# Patient Record
Sex: Male | Born: 1945 | Race: Black or African American | Hispanic: No | Marital: Married | Smoking: Never smoker
Health system: Southern US, Community
[De-identification: ages and names within clinical notes are randomized; demographics above are authoritative.]

## PROBLEM LIST (undated history)

## (undated) DIAGNOSIS — R001 Bradycardia, unspecified: Secondary | ICD-10-CM

## (undated) DIAGNOSIS — E669 Obesity, unspecified: Secondary | ICD-10-CM

## (undated) DIAGNOSIS — K648 Other hemorrhoids: Secondary | ICD-10-CM

## (undated) DIAGNOSIS — J342 Deviated nasal septum: Secondary | ICD-10-CM

## (undated) DIAGNOSIS — G4730 Sleep apnea, unspecified: Secondary | ICD-10-CM

## (undated) DIAGNOSIS — C61 Malignant neoplasm of prostate: Secondary | ICD-10-CM

## (undated) DIAGNOSIS — H9190 Unspecified hearing loss, unspecified ear: Secondary | ICD-10-CM

## (undated) DIAGNOSIS — R61 Generalized hyperhidrosis: Secondary | ICD-10-CM

## (undated) DIAGNOSIS — R209 Unspecified disturbances of skin sensation: Secondary | ICD-10-CM

## (undated) DIAGNOSIS — F419 Anxiety disorder, unspecified: Secondary | ICD-10-CM

## (undated) DIAGNOSIS — J301 Allergic rhinitis due to pollen: Secondary | ICD-10-CM

## (undated) DIAGNOSIS — K635 Polyp of colon: Secondary | ICD-10-CM

## (undated) DIAGNOSIS — L309 Dermatitis, unspecified: Secondary | ICD-10-CM

## (undated) DIAGNOSIS — M1990 Unspecified osteoarthritis, unspecified site: Secondary | ICD-10-CM

## (undated) DIAGNOSIS — R809 Proteinuria, unspecified: Secondary | ICD-10-CM

## (undated) DIAGNOSIS — N529 Male erectile dysfunction, unspecified: Secondary | ICD-10-CM

## (undated) DIAGNOSIS — I10 Essential (primary) hypertension: Secondary | ICD-10-CM

## (undated) DIAGNOSIS — L509 Urticaria, unspecified: Secondary | ICD-10-CM

## (undated) DIAGNOSIS — I129 Hypertensive chronic kidney disease with stage 1 through stage 4 chronic kidney disease, or unspecified chronic kidney disease: Secondary | ICD-10-CM

## (undated) DIAGNOSIS — R32 Unspecified urinary incontinence: Secondary | ICD-10-CM

## (undated) DIAGNOSIS — R1013 Epigastric pain: Secondary | ICD-10-CM

## (undated) HISTORY — DX: Unspecified osteoarthritis, unspecified site: M19.90

## (undated) HISTORY — DX: Male erectile dysfunction, unspecified: N52.9

## (undated) HISTORY — PX: OTHER SURGICAL HISTORY: SHX169

## (undated) HISTORY — DX: Proteinuria, unspecified: R80.9

## (undated) HISTORY — DX: Other hemorrhoids: K64.8

## (undated) HISTORY — DX: Hypertensive chronic kidney disease with stage 1 through stage 4 chronic kidney disease, or unspecified chronic kidney disease: I12.9

## (undated) HISTORY — DX: Epigastric pain: R10.13

## (undated) HISTORY — DX: Urticaria, unspecified: L50.9

## (undated) HISTORY — DX: Obesity, unspecified: E66.9

## (undated) HISTORY — DX: Unspecified disturbances of skin sensation: R20.9

## (undated) HISTORY — DX: Bradycardia, unspecified: R00.1

## (undated) HISTORY — DX: Allergic rhinitis due to pollen: J30.1

## (undated) HISTORY — DX: Generalized hyperhidrosis: R61

## (undated) HISTORY — DX: Unspecified hearing loss, unspecified ear: H91.90

## (undated) HISTORY — DX: Unspecified urinary incontinence: R32

## (undated) HISTORY — DX: Polyp of colon: K63.5

## (undated) HISTORY — DX: Malignant neoplasm of prostate: C61

## (undated) HISTORY — DX: Dermatitis, unspecified: L30.9

## (undated) HISTORY — PX: TONSILLECTOMY: SUR1361

## (undated) HISTORY — DX: Deviated nasal septum: J34.2

## (undated) HISTORY — PX: PROSTATE BIOPSY: SHX241

## (undated) HISTORY — PX: COLONOSCOPY: SHX174

---

## 2002-06-30 ENCOUNTER — Ambulatory Visit (HOSPITAL_BASED_OUTPATIENT_CLINIC_OR_DEPARTMENT_OTHER): Admission: RE | Admit: 2002-06-30 | Discharge: 2002-06-30 | Payer: Self-pay | Admitting: Internal Medicine

## 2008-06-05 DIAGNOSIS — C61 Malignant neoplasm of prostate: Secondary | ICD-10-CM

## 2008-06-05 HISTORY — DX: Malignant neoplasm of prostate: C61

## 2012-08-05 DIAGNOSIS — I10 Essential (primary) hypertension: Secondary | ICD-10-CM | POA: Insufficient documentation

## 2013-12-28 ENCOUNTER — Telehealth: Payer: Self-pay | Admitting: *Deleted

## 2013-12-28 NOTE — Telephone Encounter (Signed)
Called patient to introduce myself as Prostate Oncology Navigator and coordinator of the [facility] [place]; spoke with his wife as he was not available.  Confirmed his referral for the clinic on 01/05/14, understanding of the location of [place], arrival time of 7:45, registration procedure, and format of clinic.  She verbalized understanding.  I provided my phone number and encouraged them to call me if there are questions after receiving the Information Packet or prior to my call the day before clinic.  She verbalized understanding and indicated she would pass on information to her husband.  Gayleen Orem, RN, BSN, [facility] Prostate Oncology Navigator [phone]

## 2014-01-02 ENCOUNTER — Encounter: Payer: Self-pay | Admitting: Radiation Oncology

## 2014-01-02 NOTE — Progress Notes (Addendum)
GU Location of Tumor / Histology: prostate  If Prostate Cancer, Gleason Score is (4 + 3) and PSA is (5.25 on 12/20/13)  Patient presented 05/2008 with signs/symptoms of: elevated PSA  Biopsies of prostate (if applicable) revealed:  7/68/08     10/16/11   10/11/09   08/31/2008   06/05/2008   Past/Anticipated interventions by urology, if any: repeated biopsies, PSA  Past/Anticipated interventions by medical oncology, if any: none  Weight changes, if any:   Bowel/Bladder complaints, if any:  IPSS 10  Nausea/Vomiting, if any:   Pain issues, if any:    SAFETY ISSUES:  Prior radiation? no  Pacemaker/ICD? no  Possible current pregnancy? na  Is the patient on methotrexate? no  Current Complaints / other details:  Pension scheme manager, married, no children

## 2014-01-04 ENCOUNTER — Encounter: Payer: Self-pay | Admitting: *Deleted

## 2014-01-05 ENCOUNTER — Encounter: Payer: Self-pay | Admitting: Radiation Oncology

## 2014-01-05 ENCOUNTER — Ambulatory Visit (HOSPITAL_BASED_OUTPATIENT_CLINIC_OR_DEPARTMENT_OTHER): Payer: 59 | Admitting: Oncology

## 2014-01-05 ENCOUNTER — Encounter: Payer: Self-pay | Admitting: Specialist

## 2014-01-05 ENCOUNTER — Encounter: Payer: Self-pay | Admitting: Oncology

## 2014-01-05 ENCOUNTER — Ambulatory Visit
Admission: RE | Admit: 2014-01-05 | Discharge: 2014-01-05 | Disposition: A | Discharge status: Home / Self Care | Payer: 59 | Source: Ambulatory Visit | Attending: Radiation Oncology | Admitting: Radiation Oncology

## 2014-01-05 VITALS — BP 156/98 | HR 64 | Temp 97.6°F | Resp 20 | Ht 71.0 in | Wt 217.9 lb

## 2014-01-05 DIAGNOSIS — C61 Malignant neoplasm of prostate: Secondary | ICD-10-CM

## 2014-01-05 HISTORY — DX: Essential (primary) hypertension: I10

## 2014-01-05 HISTORY — DX: Malignant neoplasm of prostate: C61

## 2014-01-05 HISTORY — DX: Sleep apnea, unspecified: G47.30

## 2014-01-05 NOTE — Progress Notes (Signed)
Met with patient as part of [facility].  Reintroduced my role as his navigator and encouraged him to call as he proceeds with treatments and appointments at [facility].  Provided the accompanying Care Plan Summary:                                           Care Plan Summary  Name:  [name]. Every DOB:  03/27/1946  Your Medical Team:   Urologist -  Dr. Raynelle Bring, [facility]  Radiation Oncologist - Dr. Tyler Pita, [facility]   Medical Oncologist - Dr. Zola Button, [place] Recommendations: 1) Hormone therapy plus radiation. 2) Surgery - prostatectomy. * These recommendations are based on information available as of today's consult.  Recommendations may change depending on the results of further tests or exams. Next Steps: 1) Contact Dr. Alinda Money with your decision.  When appointments need to be scheduled, you will be contacted by [facility] and/or [facility].  Questions? Please do not hesitate to call Gayleen Orem, RN, BSN, [facility] at [phone] with any questions or concerns.  Liliane Channel is Counsellor and is available to assist you while you're receiving your medical care at [facility]. ______________________________________________________________________________________________________________________  I encouraged him to call me with any questions or concerns as his treatments progress.  He indicated understanding.  Gayleen Orem, RN, BSN, [facility] Prostate Oncology Navigator [phone]

## 2014-01-05 NOTE — Progress Notes (Signed)
Radiation Oncology         [phone] ________________________________  Multidisciplinary Prostate Cancer Clinic  Initial Radiation Oncology Consultation  Name: Andrew Rogers MRN: [id number]  Date: 01/05/2014  DOB: Oct 17, 1945  NA:TFTDD,UKGU M, MD  Raynelle Bring, MD   REFERRING PHYSICIAN: Raynelle Bring, MD  DIAGNOSIS: 68 y.o. gentleman with stage T1c adenocarcinoma of the prostate with a Gleason's score of 3+3 and a PSA of 6.02  HISTORY OF PRESENT ILLNESS::Andrew Rogers is a 68 y.o. gentleman diagnosed with low volume adenocarcinoma prostate with Gleason score 3+3 in 2009.  06/05/2008    He elected to pursue active surveillance and was rebiopsied:  08/31/2008    10/11/09    10/16/11     In 11/14, his PSA was 6.02.  On 12/20/13, he was noted to have an elevated PSA of 5.25, which is not significantly changed.  On that day, the patient proceeded to transrectal ultrasound with 12 biopsies of the prostate.  Out of 12 core biopsies,2 were positive.  The maximum Gleason score was now 4+3, and this was seen in the 20% of the left mid.    The patient reviewed the biopsy results with his urologist and he has kindly been referred today to the multidisciplinary prostate cancer clinic for presentation of pathology and radiology studies in our conference for discussion of potential radiation treatment options and clinical evaluation.  PREVIOUS RADIATION THERAPY: No  PAST MEDICAL HISTORY:  has a past medical history of Hypertension; Sleep apnea; and Prostate cancer (06/05/2008).    PAST SURGICAL HISTORY: Past Surgical History  Procedure Laterality Date  . Colonoscopy    . Prostate biopsy  x 5    Gleason 4+3=7, PSA 5.25 on 12/20/13, volume 65.1 cc  . Tonsillectomy      child  . Salivary gland surgey      stone removed as teen    FAMILY HISTORY: family history includes Cancer in his other; Hypertension in his mother; Seizures in his brother.  SOCIAL HISTORY:  reports that he  has never smoked. He does not have any smokeless tobacco history on file. He reports that he does not drink alcohol or use illicit drugs.  ALLERGIES: Losartan; Septra; and Lisinopril  MEDICATIONS:  Current Outpatient Prescriptions  Medication Sig Dispense Refill  . Ascorbic Acid (VITAMIN C) 100 MG tablet Take 100 mg by mouth daily.      . cholecalciferol (VITAMIN D) 400 UNITS TABS tablet Take 400 Units by mouth.      . magnesium 30 MG tablet Take 30 mg by mouth 2 (two) times daily.      . Potassium 99 MG TABS Take by mouth.      . Selenium 200 MCG CAPS Take by mouth.      . Zinc 15 MG CAPS Take by mouth.      Marland Kitchen aspirin 81 MG tablet Take 81 mg by mouth daily.      . B Complex-C (B-COMPLEX WITH VITAMIN C) tablet Take 1 tablet by mouth daily.      . Omega-3 Fatty Acids (FISH OIL PO) Take by mouth.       No current facility-administered medications for this encounter.    REVIEW OF SYSTEMS:  A 15 point review of systems is documented in the electronic medical record. This was obtained by the nursing staff. However, I reviewed this with the patient to discuss relevant findings and make appropriate changes.  A comprehensive review of systems was negative..  The patient completed an  IPSS and IIEF questionnaire.  His IPSS score was 10 indicating moderate urinary outflow obstructive symptoms.  He indicated that his erectile function is almost always able to complete sexual activity.   PHYSICAL EXAM: This patient is in no acute distress.  He is alert and oriented.   height is 5\' 11"  (1.803 m) and weight is 217 lb 14.4 oz (98.839 kg). His oral temperature is 97.6 F (36.4 C). His blood pressure is 156/98 and his pulse is 64. His respiration is 20.  He exhibits no respiratory distress or labored breathing.  He appears neurologically intact.  His mood is pleasant.  His affect is appropriate.  Please note the digital rectal exam findings described above.  KPS = 100  100 - Normal; no complaints; no  evidence of disease. 90   - Able to carry on normal activity; minor signs or symptoms of disease. 80   - Normal activity with effort; some signs or symptoms of disease. 72   - Cares for self; unable to carry on normal activity or to do active work. 60   - Requires occasional assistance, but is able to care for most of his personal needs. 50   - Requires considerable assistance and frequent medical care. 54   - Disabled; requires special care and assistance. 10   - Severely disabled; hospital admission is indicated although death not imminent. 3   - Very sick; hospital admission necessary; active supportive treatment necessary. 10   - Moribund; fatal processes progressing rapidly. 0     - Dead  Karnofsky DA, Abelmann WH, Craver LS and Burchenal JH [phone]) The use of the nitrogen mustards in the palliative treatment of carcinoma: with particular reference to bronchogenic carcinoma Cancer 1 634-56   LABORATORY DATA:  No results found for this basename: WBC, HGB, HCT, MCV, PLT   No results found for this basename: NA, K, CL, CO2   No results found for this basename: ALT, AST, GGT, ALKPHOS, BILITOT     RADIOGRAPHY: No results found.    IMPRESSION: This gentleman is a 68 y.o. gentleman with stage T1c adenocarcinoma of the prostate with a Gleason's score of 3+3 and a PSA of 6.02.  His T-Stage, Gleason's Score, and PSA put him into the favorable risk group.  Accordingly he is eligible for a variety of potential treatment options including radical prostatectomy versus radiation treatment with or without androgen deprivation.  PLAN:Today I reviewed the findings and workup thus far.  We discussed the natural history of prostate cancer.  We reviewed the the implications of T-stage, Gleason's Score, and PSA on decision-making and outcomes related to prostate cancer.  We discussed radiation treatment in the management of prostate cancer with regard to the logistics and delivery of external beam  radiation treatment as well as the logistics and delivery of prostate brachytherapy.  We compared and contrasted each of these approaches and also compared these against prostatectomy.    The patient focused most of his questions and interest in robotic-assisted laparoscopic radical prostatectomy.  We discussed some of the potential advantages of surgery including surgical staging, the availability of salvage radiotherapy to the prostatic fossa, and the confidence associated with immediate biochemical response.  We discussed some of the potential proven indications for postoperative radiotherapy including positive margins, extracapsular extension, and seminal vesicle involvement. We also talked about some of the other potential findings leading to a recommendation for radiotherapy including a non-zero postoperative PSA and positive lymph nodes.   I filled out a  patient counseling form for him outlining his disease characteristics with relevant treatment diagrams and a thorough delineation of radiation including the logistics. The counseling form highlighted our discussion on the potential side effects associated with radiation therapy. The patient was given the form and we retained a copy for our records.   The patient would like to proceed with prostatectomy. I enjoyed meeting with him today, and will look forward to participating in the care of this very nice gentleman.  I will look forward to following his progress   I spent 45 minutes minutes face to face with the patient and more than 50% of that time was spent in counseling and/or coordination of care.     ------------------------------------------------  Sheral Apley. Tammi Klippel, M.D.

## 2014-01-05 NOTE — Progress Notes (Signed)
Please see consult note.  

## 2014-01-05 NOTE — Progress Notes (Signed)
Called patient to see if he had any questions prior to his attendance at tomorrow's [facility] [place] and to confirm arrival time.  LVM with reminder of 0745 arrival request.  Wife later returned call confirming understanding.  Gayleen Orem, RN, BSN, [facility] Prostate Oncology Navigator [phone]

## 2014-01-05 NOTE — Progress Notes (Signed)
Met patient and spouse in [facility] [place]. Patient said he had little or no stress, zero on the distress screen. He also said he has been "dealing with" this situation for 6 years. Provided educational information about support center programs and services and, particularly, the Prostate Support Group.  Epifania Gore, PhD, [place]

## 2014-01-05 NOTE — Consult Note (Signed)
History of Present Illness  Andrew Rogers is a 68 year old with the following urologic history:  1) Prostate cancer: He has a history of an elevated PSA and did undergo a prostate biopsy in 2005 which was negative. 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 Only 1/12 cores was involved. After a discussion of management/treatment options, he elected to proceed with active surveillance with delayed intervention if necessary. He was noted to have clinical progression in May 2015 with upgrading to Gleason 4+3=7. His last PSA was 5.25.  Initial diagnosis: November 2009 TNM stage: cT1c Nx Mx Gleason score: 3+3 = 6 PSA at diagnosis: 5.07  Biopsies: Nov 2009 (12 core): 1/12 cores positive - left mid (small focus), Vol 49.5 cc Feb 2010 (26 core): HGPIN, No malignancy, Vol 53.7 cc Mar 2011 (12 core): Negative, Vol 60.0 cc Mar 2013 (12 core): 1/12 cores positive - left lateral mid (< 5%), Vol 65.1 cc May 2015: (12 cores): 2/12 cores positive - L mid (20%, 4+3=7, PNI), R lateral mid (10%, 3+3=6), Vol 85.7 cc  Baseline urinary function: He has had mild symptoms including urinary frequency, intermittency, and a weak stream. These symptoms have not bothersome to the patient. Baseline IPSS: 7. Baseline erectile function: He denied erectile dysfunction. Baseline SHIM: 25.  Interval history:  Mr. Qin is seen today in follow-up after his recent surveillance biopsy which indicated upgraded prostate cancer as stated above.  He is seen today in the multidisciplinary clinic to discuss his options for management/treatment and is seen today in the company of his wife.     Past Medical History  1. History of hypertension (V12.59)  2. History of sleep apnea (V13.89)  3. Prostate cancer (185)  Surgical History  1. History of Complete Colonoscopy  2. History of  Surgery Sialolithotomy  3. History of Tonsillectomy  Current Meds  1. AmLODIPine Besylate 2.5 MG Oral Tablet;  Therapy: 29Aug2014 to Recorded  2. CPAP;  Therapy: (Recorded:27Feb2013) to Recorded  3. Losartan Potassium 50 MG Oral Tablet;  Therapy: 07Apr2014 to Recorded  4. Potassium TABS;  Therapy: (Recorded:27Feb2013) to Recorded  Allergies  1. Septra TABS  Family History  1. Family history of Convulsive Disorder : Brother  2. Family history of Hypertension (V17.49) : Mother  3. Denied: Family history of Prostate Cancer  Social History   Denied: Alcohol Use   Never A Smoker   Occupation:   Denied: Tobacco Use  Physical Exam Constitutional: Well nourished and well developed . No acute distress.    Results/Data  I have reviewed his recent biopsy results, if it wasn't, and pathology report.     Assessment  1. Prostate cancer (185)  Discussion/Summary  1.  Prostate cancer: I had a detailed discussion with Mr. Barca and his wife today.  We discussed that his prostate cancer is upgraded and I have recommended that he proceed with therapy of curative intent.  We reviewed available treatment options for prostate cancer and specifically focused our discussion on options including primary surgical therapy versus external beam radiation therapy with short-term androgen deprivation therapy.  We reviewed the pros and cons of these approaches in detail.   The patient was counseled about the natural history of prostate cancer and the standard treatment options that are available for prostate cancer. It was explained to him how his age and life  expectancy, clinical stage, Gleason score, and PSA affect his prognosis, the decision to proceed with additional staging studies, as well as how that information influences recommended treatment strategies. We discussed the roles for active surveillance, radiation therapy, surgical therapy, androgen deprivation, as well as ablative therapy options  for the treatment of prostate cancer as appropriate to his individual cancer situation. We discussed the risks and benefits of these options with regard to their impact on cancer control and also in terms of potential adverse events, complications, and impact on quiality of life particularly related to urinary, bowel, and sexual function. The patient was encouraged to ask questions throughout the discussion today and all questions were answered to his stated satisfaction. In addition, the patient was provided with and/or directed to appropriate resources and literature for further education about prostate cancer and treatment options.   I answered numerous questions for Mr. Shellhammer and his wife today.  He also was seen by Dr. Alen Blew and Dr. Tammi Klippel.  He is going to consider his options but appears to be leaning towards surgical therapy.  He will plan to contact me next week to further update me on his decision process.  If he did proceed with surgical therapy, he will be scheduled for a bilateral nerve sparing robotic-assisted laparoscopic radical prostatectomy and bilateral pelvic lymphadenectomy.  I also will plan to have him return to the office to further discuss surgical treatment in detail including the potential risks of surgery.  Cc: Dr. Tyler Pita Dr. Zola Button Dr. Shon Baton  A total of 55 minutes were spent in the overall care of the patient today with 55 minutes in direct face to face consultation.    Signatures Electronically signed by : Raynelle Bring, M.D.; Jan 05 2014 11:49AM EST

## 2014-01-05 NOTE — Consult Note (Signed)
Reason for Referral: Prostate cancer.   HPI: 68 year old gentleman native of [place] but currently lives in [place]. Is a rather healthy gentleman without any significant comorbid conditions with a long-standing history of elevated PSA the dates back to 2005. He have underwent a biopsy at that time and did not show any evidence of prostate cancer. In November of 2009 Andrew PSA was up to 5.07 and underwent a biopsy under the care of Dr. Alinda Money. The biopsy showed a Gleason score 3+3 equals 6 in only 1 out of the 12 cores were involved. He was initiated on surveillance protocol with multiple biopsies done since that time. He continued to have very little malignancy on Andrew biopsies dating back in 2009, 2010, 2011 in 2013. Andrew most recent biopsy done on 12/20/2013 showed a Gleason score 4+3 equals 7 in about 20% of the core. He also had another core of 3+3 equals 6. Andrew PSA at that time was 5.25. Patient referred to to the prostate cancer multidisciplinary clinic for review.  Clinically, he is very little genitourinary symptoms. He does report urgency and some nocturia. He does not report any hesitancy or incontinence. He does not report any hematuria or dysuria. He does not report any constitutional symptoms of fevers or chills or sweats. Does not report any headaches blurred vision or double vision. He does not report any neurological symptoms of syncope or seizures. Does not report any chest pain shortness of breath cough or hemoptysis. Does not report any nausea vomiting abdominal pain. Does not report any musculoskeletal complaints. Is not report any arthralgias or myalgias. Rest of Andrew review of system is unremarkable.   Past Medical History  Diagnosis Date  . Hypertension   . Sleep apnea   . Prostate cancer 06/05/2008    gleason 6  :  Past Surgical History  Procedure Laterality Date  . Colonoscopy    . Prostate biopsy  x 5    Gleason 4+3=7, PSA 5.25 on 12/20/13, volume 65.1 cc   . Tonsillectomy      child  . Salivary gland surgey      stone removed as teen  :  Current outpatient prescriptions:Ascorbic Acid (VITAMIN C) 100 MG tablet, Take 100 mg by mouth daily., Disp: , Rfl: ;  aspirin 81 MG tablet, Take 81 mg by mouth daily., Disp: , Rfl: ;  B Complex-C (B-COMPLEX WITH VITAMIN C) tablet, Take 1 tablet by mouth daily., Disp: , Rfl: ;  cholecalciferol (VITAMIN D) 400 UNITS TABS tablet, Take 400 Units by mouth., Disp: , Rfl: ;  magnesium 30 MG tablet, Take 30 mg by mouth 2 (two) times daily., Disp: , Rfl:  Omega-3 Fatty Acids (FISH OIL PO), Take by mouth., Disp: , Rfl: ;  Potassium 99 MG TABS, Take by mouth., Disp: , Rfl: ;  Selenium 200 MCG CAPS, Take by mouth., Disp: , Rfl: ;  Zinc 15 MG CAPS, Take by mouth., Disp: , Rfl: :  Allergies  Allergen Reactions  . Losartan     Allergy symptoms  . Septra [Sulfamethoxazole-Trimethoprim]     Flu like symptoms  . Lisinopril Rash  :  Family History  Problem Relation Age of Onset  . Hypertension Mother   . Seizures Brother   . Cancer Other     cervical  :  History   Social History  . Marital Status: Married    Spouse Name: N/A    Number of Children: N/A  . Years of Education: N/A   Occupational  History  . Not on file.   Social History Main Topics  . Smoking status: Never Smoker   . Smokeless tobacco: Not on file  . Alcohol Use: No  . Drug Use: No  . Sexual Activity: Not on file   Other Topics Concern  . Not on file   Social History Narrative  . No narrative on file  :  Pertinent items are noted in HPI.  Exam: ECOG 0 There were no vitals taken for this visit. General appearance: alert and cooperative Head: Normocephalic, without obvious abnormality Throat: lips, mucosa, and tongue normal; teeth and gums normal Neck: no adenopathy Back: symmetric, no curvature. ROM normal. No CVA tenderness. Resp: clear to auscultation bilaterally Cardio: regular rate and rhythm, S1, S2 normal, no murmur,  click, rub or gallop GI: soft, non-tender; bowel sounds normal; no masses,  no organomegaly Extremities: extremities normal, atraumatic, no cyanosis or edema Pulses: 2+ and symmetric Skin: Skin color, texture, turgor normal. No rashes or lesions   Assessment and Plan:   68 year old gentleman diagnosed with prostate cancer in 2009 when he presented with a PSA of about 5.07 and a biopsy confirmed the presence of a Gleason score 3+3 equals 6 in 1/12 cores. Andrew clinical staging was T1 C. and have been on active surveillance since that time. Repeated biopsy over the time did not really show any evidence to suggest progressive disease so recently. Andrew most recent biopsy in May of 2015 showed a Gleason score of 4+3 equals 7 and a PSA 5.25. He had 20% involvement of the core with Gleason 7 cancer. Treatment options were discussed today as a part of the multidisciplinary clinic including discussion with the pathologist as well as Dr. Alinda Money and Dr. Tammi Klippel from radiation oncology. I discussed the natural course of this disease with Andrew Rogers and Andrew Rogers as well as treatment options. I think he has multiple options at this time and he will be a candidate for surgery or radiation therapy to avoid surgical intervention. He does not really require any systemic therapy at this time including chemotherapy, immunotherapy.   He will like to evaluate all Andrew options today and think about it before he will make a decision. All Andrew questions were answered today to Andrew satisfaction.

## 2014-03-13 ENCOUNTER — Other Ambulatory Visit: Payer: Self-pay | Admitting: Urology

## 2014-03-14 ENCOUNTER — Other Ambulatory Visit: Payer: Self-pay | Admitting: Urology

## 2014-04-09 ENCOUNTER — Encounter (HOSPITAL_COMMUNITY)
Admission: RE | Admit: 2014-04-09 | Discharge: 2014-04-09 | Disposition: A | Discharge status: Home / Self Care | Payer: Medicare Other | Source: Ambulatory Visit | Attending: Urology | Admitting: Urology

## 2014-04-09 ENCOUNTER — Encounter (HOSPITAL_COMMUNITY): Payer: Self-pay | Admitting: Pharmacy Technician

## 2014-04-09 ENCOUNTER — Ambulatory Visit (HOSPITAL_COMMUNITY)
Admission: RE | Admit: 2014-04-09 | Discharge: 2014-04-09 | Disposition: A | Discharge status: Home / Self Care | Payer: Medicare Other | Source: Ambulatory Visit | Attending: Anesthesiology | Admitting: Anesthesiology

## 2014-04-09 ENCOUNTER — Encounter (INDEPENDENT_AMBULATORY_CARE_PROVIDER_SITE_OTHER): Payer: Self-pay

## 2014-04-09 ENCOUNTER — Encounter (HOSPITAL_COMMUNITY): Payer: Self-pay

## 2014-04-09 DIAGNOSIS — Z01818 Encounter for other preprocedural examination: Secondary | ICD-10-CM | POA: Diagnosis present

## 2014-04-09 DIAGNOSIS — C61 Malignant neoplasm of prostate: Secondary | ICD-10-CM | POA: Diagnosis present

## 2014-04-09 HISTORY — DX: Anxiety disorder, unspecified: F41.9

## 2014-04-09 LAB — CBC
HCT: 43.6 % (ref 39.0–52.0)
Hemoglobin: 14.9 g/dL (ref 13.0–17.0)
MCH: 31.2 pg (ref 26.0–34.0)
MCHC: 34.2 g/dL (ref 30.0–36.0)
MCV: 91.2 fL (ref 78.0–100.0)
PLATELETS: 155 10*3/uL (ref 150–400)
RBC: 4.78 MIL/uL (ref 4.22–5.81)
RDW: 12.5 % (ref 11.5–15.5)
WBC: 6.5 10*3/uL (ref 4.0–10.5)

## 2014-04-09 LAB — BASIC METABOLIC PANEL
Anion gap: 7 (ref 5–15)
BUN: 10 mg/dL (ref 6–23)
CO2: 29 mEq/L (ref 19–32)
Calcium: 9.6 mg/dL (ref 8.4–10.5)
Chloride: 104 mEq/L (ref 96–112)
Creatinine, Ser: 0.96 mg/dL (ref 0.50–1.35)
GFR, EST NON AFRICAN AMERICAN: 84 mL/min — AB (ref >90)
Glucose, Bld: 113 mg/dL — ABNORMAL HIGH (ref 70–99)
POTASSIUM: 4.3 meq/L (ref 3.7–5.3)
Sodium: 140 mEq/L (ref 137–147)

## 2014-04-09 LAB — ABO/RH: ABO/RH(D): A POS

## 2014-04-09 NOTE — Pre-Procedure Instructions (Signed)
EKG AND CXR WERE DONE TODAY - PREOP AT [facility]. 

## 2014-04-09 NOTE — Patient Instructions (Addendum)
YOUR SURGERY IS SCHEDULED AT [facility]  ON:  Thursday  9/17  REPORT TO  SHORT STAY CENTER AT:  5:15 AM   PLEASE COME IN THE [place] ENTRANCE AND FOLLOW SIGNS TO SHORT STAY CENTER.  DO NOT EAT OR DRINK ANYTHING AFTER MIDNIGHT THE NIGHT BEFORE YOUR SURGERY.  YOU MAY BRUSH YOUR TEETH, RINSE OUT YOUR MOUTH--BUT NO WATER, NO FOOD, NO CHEWING GUM, NO MINTS, NO CANDIES, NO CHEWING TOBACCO.  PLEASE TAKE THE FOLLOWING MEDICATIONS THE AM OF YOUR SURGERY WITH A FEW SIPS OF WATER:  HYDRALAZINE  IF YOU HAVE SLEEP APNEA AND USE CPAP OR BIPAP--PLEASE BRING THE MASK AND THE TUBING.  DO NOT BRING YOUR MACHINE.  DO NOT BRING VALUABLES, MONEY, CREDIT CARDS.  DO NOT WEAR JEWELRY, MAKE-UP, NAIL POLISH AND NO METAL PINS OR CLIPS IN YOUR HAIR. CONTACT LENS, DENTURES / PARTIALS, GLASSES SHOULD NOT BE WORN TO SURGERY AND IN MOST CASES-HEARING AIDS WILL NEED TO BE REMOVED.  BRING YOUR GLASSES CASE, ANY EQUIPMENT NEEDED FOR YOUR CONTACT LENS. FOR PATIENTS ADMITTED TO THE HOSPITAL--CHECK OUT TIME THE DAY OF DISCHARGE IS 11:00 AM.  ALL INPATIENT ROOMS ARE PRIVATE - WITH BATHROOM, TELEPHONE, TELEVISION AND WIFI INTERNET.                                                    PLEASE BE AWARE THAT YOU MAY NEED ADDITIONAL BLOOD DRAWN DAY OF YOUR SURGERY  _______________________________________________________________________   [facility] - Preparing for Surgery Before surgery, you can play an important role.  Because skin is not sterile, your skin needs to be as free of germs as possible.  You can reduce the number of germs on your skin by washing with CHG (chlorahexidine gluconate) soap before surgery.  CHG is an antiseptic cleaner which kills germs and bonds with the skin to continue killing germs even after washing. Please DO NOT use if you have an allergy to CHG or antibacterial soaps.  If your skin becomes reddened/irritated stop using the CHG and inform your nurse when you arrive at Short  Stay. Do not shave (including legs and underarms) for at least 48 hours prior to the first CHG shower.  You may shave your face/neck. Please follow these instructions carefully:  1.  Shower with CHG Soap the night before surgery and the  morning of Surgery.  2.  If you choose to wash your hair, wash your hair first as usual with your  normal  shampoo.  3.  After you shampoo, rinse your hair and body thoroughly to remove the  shampoo.                           4.  Use CHG as you would any other liquid soap.  You can apply chg directly  to the skin and wash                       Gently with a scrungie or clean washcloth.  5.  Apply the CHG Soap to your body ONLY FROM THE NECK DOWN.   Do not use on face/ open                           Wound  or open sores. Avoid contact with eyes, ears mouth and genitals (private parts).                       Wash face,  Genitals (private parts) with your normal soap.             6.  Wash thoroughly, paying special attention to the area where your surgery  will be performed.  7.  Thoroughly rinse your body with warm water from the neck down.  8.  DO NOT shower/wash with your normal soap after using and rinsing off  the CHG Soap.                9.  Pat yourself dry with a clean towel.            10.  Wear clean pajamas.            11.  Place clean sheets on your bed the night of your first shower and do not  sleep with pets. Day of Surgery : Do not apply any lotions/deodorants the morning of surgery.  Please wear clean clothes to the hospital/surgery center.  FAILURE TO FOLLOW THESE INSTRUCTIONS MAY RESULT IN THE CANCELLATION OF YOUR SURGERY PATIENT SIGNATURE_________________________________  NURSE SIGNATURE__________________________________  ________________________________________________________________________   Adam Phenix  An incentive spirometer is a tool that can help keep your lungs clear and active. This tool measures how well you are  filling your lungs with each breath. Taking long deep breaths may help reverse or decrease the chance of developing breathing (pulmonary) problems (especially infection) following:  A long period of time when you are unable to move or be active. BEFORE THE PROCEDURE   If the spirometer includes an indicator to show your best effort, your nurse or respiratory therapist will set it to a desired goal.  If possible, sit up straight or lean slightly forward. Try not to slouch.  Hold the incentive spirometer in an upright position. INSTRUCTIONS FOR USE  1. Sit on the edge of your bed if possible, or sit up as far as you can in bed or on a chair. 2. Hold the incentive spirometer in an upright position. 3. Breathe out normally. 4. Place the mouthpiece in your mouth and seal your lips tightly around it. 5. Breathe in slowly and as deeply as possible, raising the piston or the ball toward the top of the column. 6. Hold your breath for 3-5 seconds or for as long as possible. Allow the piston or ball to fall to the bottom of the column. 7. Remove the mouthpiece from your mouth and breathe out normally. 8. Rest for a few seconds and repeat Steps 1 through 7 at least 10 times every 1-2 hours when you are awake. Take your time and take a few normal breaths between deep breaths. 9. The spirometer may include an indicator to show your best effort. Use the indicator as a goal to work toward during each repetition. 10. After each set of 10 deep breaths, practice coughing to be sure your lungs are clear. If you have an incision (the cut made at the time of surgery), support your incision when coughing by placing a pillow or rolled up towels firmly against it. Once you are able to get out of bed, walk around indoors and cough well. You may stop using the incentive spirometer when instructed by your caregiver.  RISKS AND COMPLICATIONS  Take your time so you do not get dizzy  or light-headed.  If you are in pain,  you may need to take or ask for pain medication before doing incentive spirometry. It is harder to take a deep breath if you are having pain. AFTER USE  Rest and breathe slowly and easily.  It can be helpful to keep track of a log of your progress. Your caregiver can provide you with a simple table to help with this. If you are using the spirometer at home, follow these instructions: [place] IF:   You are having difficultly using the spirometer.  You have trouble using the spirometer as often as instructed.  Your pain medication is not giving enough relief while using the spirometer.  You develop fever of 100.5 F (38.1 C) or higher. SEEK IMMEDIATE MEDICAL CARE IF:   You cough up bloody sputum that had not been present before.  You develop fever of 102 F (38.9 C) or greater.  You develop worsening pain at or near the incision site. MAKE SURE YOU:   Understand these instructions.  Will watch your condition.  Will get help right away if you are not doing well or get worse. Document Released: 11/23/2006 Document Revised: 10/05/2011 Document Reviewed: 01/24/2007 ExitCare Patient Information 2014 ExitCare, [place].   ________________________________________________________________________  WHAT IS A BLOOD TRANSFUSION? Blood Transfusion Information  A transfusion is the replacement of blood or some of its parts. Blood is made up of multiple cells which provide different functions.  Red blood cells carry oxygen and are used for blood loss replacement.  White blood cells fight against infection.  Platelets control bleeding.  Plasma helps clot blood.  Other blood products are available for specialized needs, such as hemophilia or other clotting disorders. BEFORE THE TRANSFUSION  Who gives blood for transfusions?   Healthy volunteers who are fully evaluated to make sure their blood is safe. This is blood bank blood. Transfusion therapy is the safest it has ever  been in the practice of medicine. Before blood is taken from a donor, a complete history is taken to make sure that person has no history of diseases nor engages in risky social behavior (examples are intravenous drug use or sexual activity with multiple partners). The donor's travel history is screened to minimize risk of transmitting infections, such as malaria. The donated blood is tested for signs of infectious diseases, such as HIV and hepatitis. The blood is then tested to be sure it is compatible with you in order to minimize the chance of a transfusion reaction. If you or a relative donates blood, this is often done in anticipation of surgery and is not appropriate for emergency situations. It takes many days to process the donated blood. RISKS AND COMPLICATIONS Although transfusion therapy is very safe and saves many lives, the main dangers of transfusion include:   Getting an infectious disease.  Developing a transfusion reaction. This is an allergic reaction to something in the blood you were given. Every precaution is taken to prevent this. The decision to have a blood transfusion has been considered carefully by your caregiver before blood is given. Blood is not given unless the benefits outweigh the risks. AFTER THE TRANSFUSION  Right after receiving a blood transfusion, you will usually feel much better and more energetic. This is especially true if your red blood cells have gotten low (anemic). The transfusion raises the level of the red blood cells which carry oxygen, and this usually causes an energy increase.  The nurse administering the transfusion will monitor  you carefully for complications. HOME CARE INSTRUCTIONS  No special instructions are needed after a transfusion. You may find your energy is better. Speak with your caregiver about any limitations on activity for underlying diseases you may have. SEEK MEDICAL CARE IF:   Your condition is not improving after your  transfusion.  You develop redness or irritation at the intravenous (IV) site. SEEK IMMEDIATE MEDICAL CARE IF:  Any of the following symptoms occur over the next 12 hours:  Shaking chills.  You have a temperature by mouth above 102 F (38.9 C), not controlled by medicine.  Chest, back, or muscle pain.  People around you feel you are not acting correctly or are confused.  Shortness of breath or difficulty breathing.  Dizziness and fainting.  You get a rash or develop hives.  You have a decrease in urine output.  Your urine turns a dark color or changes to pink, red, or brown. Any of the following symptoms occur over the next 10 days:  You have a temperature by mouth above 102 F (38.9 C), not controlled by medicine.  Shortness of breath.  Weakness after normal activity.  The white part of the eye turns yellow (jaundice).  You have a decrease in the amount of urine or are urinating less often.  Your urine turns a dark color or changes to pink, red, or brown. Document Released: 07/10/2000 Document Revised: 10/05/2011 Document Reviewed: 02/27/2008 [facility] Patient Information 2014 [place], [place].  _______________________________________________________________________

## 2014-04-11 NOTE — H&P (Signed)
  History of Present Illness [name] is a 68 year old with the following urologic history:  1) Prostate cancer: He has a history of an elevated PSA and did undergo a prostate biopsy in 2005 which was negative. 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 Only 1/12 cores was involved. After a discussion of management/treatment options, he elected to proceed with active surveillance with delayed intervention if necessary. He was noted to have clinical progression in May 2015 with upgrading to Gleason 4+3=7. His last PSA was 5.25.  Initial diagnosis: November 2009 TNM stage: cT1c Nx Mx Gleason score: 3+3 = 6 PSA at diagnosis: 5.07   Biopsies: Nov 2009 (12 core): 1/12 cores positive - left mid (small focus), Vol 49.5 cc Feb 2010 (26 core): HGPIN, No malignancy, Vol 53.7 cc Mar 2011 (12 core): Negative, Vol 60.0 cc Mar 2013 (12 core): 1/12 cores positive - left lateral mid (< 5%), Vol 65.1 cc May 2015: (12 cores): 2/12 cores positive - L mid (20%, 4+3=7, PNI), R lateral mid (10%, 3+3=6), Vol 85.7 cc   Baseline urinary function: He has had mild symptoms including urinary frequency, intermittency, and a weak stream. These symptoms have not bothersome to the patient. Baseline IPSS: 7. Baseline erectile function: He denied erectile dysfunction. Baseline SHIM: 25.      Past Medical History Problems  1. History of hypertension (V12.59) 2. History of sleep apnea (V13.89) 3. Prostate cancer (185)  Surgical History Problems  1. History of Complete Colonoscopy 2. History of Surgery Sialolithotomy 3. History of Tonsillectomy  Current Meds 1. AmLODIPine Besylate 2.5 MG Oral Tablet;  Therapy: 29Aug2014 to Recorded 2. CPAP;  Therapy: (Recorded:27Feb2013) to Recorded 3. Losartan Potassium 50 MG Oral Tablet;  Therapy: 07Apr2014 to Recorded 4.  Potassium TABS;  Therapy: (Recorded:27Feb2013) to Recorded  Allergies Medication  1. Septra TABS  Family History Problems  1. Family history of Convulsive Disorder : Brother 2. Family history of Hypertension (V17.49) : Mother 3. Denied: Family history of Prostate Cancer  Social History Problems  1. Denied: Alcohol Use 2. Never A Smoker 3. Occupation:   Pension scheme manager 4. Denied: Tobacco Use    Physical Exam Constitutional: Well nourished and well developed . No acute distress.  ENT:. The ears and nose are normal in appearance.  Neck: The appearance of the neck is normal and no neck mass is present.  Pulmonary: No respiratory distress, normal respiratory rhythm and effort and clear bilateral breath sounds.  Cardiovascular: Heart rate and rhythm are normal . No peripheral edema.  Neuro/Psych:. Mood and affect are appropriate.     Assessment Assessed  1. Prostate cancer (185)    Discussion/Summary 1. Prostate cancer:   He will undergo a bilateral nerve sparing robotic-assisted laparoscopic radical prostatectomy and bilateral pelvic lymphadenectomy. All questions were answered to his stated satisfaction today and he feels very comfortable with the plan in place and has appropriate expectations for his postoperative recovery and outcomes with regard to cancer control, urinary control, and erectile function.

## 2014-04-11 NOTE — Anesthesia Preprocedure Evaluation (Signed)
Anesthesia Evaluation  Patient identified by MRN, date of birth, ID band Patient awake    Reviewed: Allergy & Precautions, H&P , NPO status , Patient's Chart, lab work & pertinent test results  Airway Mallampati: II TM Distance: >3 FB Neck ROM: Full    Dental no notable dental hx.    Pulmonary sleep apnea ,  breath sounds clear to auscultation  Pulmonary exam normal       Cardiovascular hypertension, Pt. on medications and Pt. on home beta blockers Rhythm:Regular Rate:Normal     Neuro/Psych PSYCHIATRIC DISORDERS Anxiety negative neurological ROS     GI/Hepatic negative GI ROS, Neg liver ROS,   Endo/Other  negative endocrine ROS  Renal/GU negative Renal ROS     Musculoskeletal negative musculoskeletal ROS (+)   Abdominal   Peds  Hematology negative hematology ROS (+)   Anesthesia Other Findings   Reproductive/Obstetrics negative OB ROS                           Anesthesia Physical Anesthesia Plan  ASA: II  Anesthesia Plan: General   Post-op Pain Management:    Induction: Intravenous  Airway Management Planned: Oral ETT  Additional Equipment:   Intra-op Plan:   Post-operative Plan: Extubation in OR  Informed Consent: I have reviewed the patients History and Physical, chart, labs and discussed the procedure including the risks, benefits and alternatives for the proposed anesthesia with the patient or authorized representative who has indicated his/her understanding and acceptance.   Dental advisory given  Plan Discussed with: CRNA  Anesthesia Plan Comments:         Anesthesia Quick Evaluation

## 2014-04-12 ENCOUNTER — Inpatient Hospital Stay (HOSPITAL_COMMUNITY): Payer: Medicare Other | Admitting: Certified Registered"

## 2014-04-12 ENCOUNTER — Encounter (HOSPITAL_COMMUNITY): Payer: Medicare Other | Admitting: Certified Registered"

## 2014-04-12 ENCOUNTER — Encounter (HOSPITAL_COMMUNITY): Payer: Self-pay | Admitting: *Deleted

## 2014-04-12 ENCOUNTER — Encounter (HOSPITAL_COMMUNITY)
Admission: RE | Disposition: A | Discharge status: Home / Self Care | Payer: Self-pay | Source: Ambulatory Visit | Attending: Urology

## 2014-04-12 ENCOUNTER — Inpatient Hospital Stay (HOSPITAL_COMMUNITY)
Admission: RE | Admit: 2014-04-12 | Discharge: 2014-04-13 | DRG: 708 | Disposition: A | Discharge status: Home / Self Care | Payer: Medicare Other | Source: Ambulatory Visit | Attending: Urology | Admitting: Urology

## 2014-04-12 DIAGNOSIS — Z79899 Other long term (current) drug therapy: Secondary | ICD-10-CM | POA: Diagnosis not present

## 2014-04-12 DIAGNOSIS — G4730 Sleep apnea, unspecified: Secondary | ICD-10-CM | POA: Diagnosis present

## 2014-04-12 DIAGNOSIS — I10 Essential (primary) hypertension: Secondary | ICD-10-CM | POA: Diagnosis present

## 2014-04-12 DIAGNOSIS — C61 Malignant neoplasm of prostate: Secondary | ICD-10-CM | POA: Diagnosis present

## 2014-04-12 DIAGNOSIS — Z881 Allergy status to other antibiotic agents status: Secondary | ICD-10-CM

## 2014-04-12 DIAGNOSIS — Z8249 Family history of ischemic heart disease and other diseases of the circulatory system: Secondary | ICD-10-CM

## 2014-04-12 HISTORY — PX: LYMPHADENECTOMY: SHX5960

## 2014-04-12 HISTORY — PX: ROBOT ASSISTED LAPAROSCOPIC RADICAL PROSTATECTOMY: SHX5141

## 2014-04-12 LAB — TYPE AND SCREEN
ABO/RH(D): A POS
Antibody Screen: NEGATIVE

## 2014-04-12 LAB — HEMOGLOBIN AND HEMATOCRIT, BLOOD
HEMATOCRIT: 39.1 % (ref 39.0–52.0)
HEMOGLOBIN: 13.3 g/dL (ref 13.0–17.0)

## 2014-04-12 SURGERY — ROBOTIC ASSISTED LAPAROSCOPIC RADICAL PROSTATECTOMY LEVEL 2
Anesthesia: General

## 2014-04-12 MED ORDER — DOCUSATE SODIUM 100 MG PO CAPS
100.0000 mg | ORAL_CAPSULE | Freq: Two times a day (BID) | ORAL | Status: DC
  Administered 2014-04-12 – 2014-04-13 (×2): 100 mg via ORAL
  Filled 2014-04-12 (×3): qty 1

## 2014-04-12 MED ORDER — ONDANSETRON HCL 4 MG/2ML IJ SOLN
INTRAMUSCULAR | Status: AC
  Filled 2014-04-12: qty 2

## 2014-04-12 MED ORDER — GLYCOPYRROLATE 0.2 MG/ML IJ SOLN
INTRAMUSCULAR | Status: DC | PRN
  Administered 2014-04-12: .6 mg via INTRAVENOUS
  Administered 2014-04-12: 0.2 mg via INTRAVENOUS

## 2014-04-12 MED ORDER — MIDAZOLAM HCL 5 MG/5ML IJ SOLN
INTRAMUSCULAR | Status: DC | PRN
  Administered 2014-04-12: 2 mg via INTRAVENOUS

## 2014-04-12 MED ORDER — BUPIVACAINE-EPINEPHRINE (PF) 0.25% -1:200000 IJ SOLN
INTRAMUSCULAR | Status: AC
  Filled 2014-04-12: qty 30

## 2014-04-12 MED ORDER — SODIUM CHLORIDE 0.9 % IV BOLUS (SEPSIS)
1000.0000 mL | Freq: Once | INTRAVENOUS | Status: AC
  Administered 2014-04-12: 1000 mL via INTRAVENOUS

## 2014-04-12 MED ORDER — GLYCOPYRROLATE 0.2 MG/ML IJ SOLN
INTRAMUSCULAR | Status: AC
  Filled 2014-04-12: qty 1

## 2014-04-12 MED ORDER — PROPOFOL 10 MG/ML IV BOLUS
INTRAVENOUS | Status: DC | PRN
  Administered 2014-04-12: 200 mg via INTRAVENOUS

## 2014-04-12 MED ORDER — ACETAMINOPHEN 325 MG PO TABS: 650.0000 mg | ORAL_TABLET | ORAL | Status: DC | PRN

## 2014-04-12 MED ORDER — CEFAZOLIN SODIUM-DEXTROSE 2-3 GM-% IV SOLR
INTRAVENOUS | Status: AC
  Filled 2014-04-12: qty 50

## 2014-04-12 MED ORDER — STERILE WATER FOR IRRIGATION IR SOLN
Status: DC | PRN
  Administered 2014-04-12: 3000 mL

## 2014-04-12 MED ORDER — GLYCOPYRROLATE 0.2 MG/ML IJ SOLN
INTRAMUSCULAR | Status: AC
  Filled 2014-04-12: qty 3

## 2014-04-12 MED ORDER — HYDROMORPHONE HCL 1 MG/ML IJ SOLN
INTRAMUSCULAR | Status: DC | PRN
  Administered 2014-04-12 (×2): 1 mg via INTRAVENOUS

## 2014-04-12 MED ORDER — HEPARIN SODIUM (PORCINE) 1000 UNIT/ML IJ SOLN
INTRAMUSCULAR | Status: AC
  Filled 2014-04-12: qty 1

## 2014-04-12 MED ORDER — ROCURONIUM BROMIDE 100 MG/10ML IV SOLN
INTRAVENOUS | Status: DC | PRN
  Administered 2014-04-12: 10 mg via INTRAVENOUS
  Administered 2014-04-12: 5 mg via INTRAVENOUS
  Administered 2014-04-12: 10 mg via INTRAVENOUS
  Administered 2014-04-12: 50 mg via INTRAVENOUS

## 2014-04-12 MED ORDER — SODIUM CHLORIDE 0.9 % IR SOLN
Status: DC | PRN
  Administered 2014-04-12: 1000 mL via INTRAVESICAL

## 2014-04-12 MED ORDER — CEFAZOLIN SODIUM 1-5 GM-% IV SOLN
1.0000 g | Freq: Three times a day (TID) | INTRAVENOUS | Status: AC
  Administered 2014-04-12 – 2014-04-13 (×2): 1 g via INTRAVENOUS
  Filled 2014-04-12 (×2): qty 50

## 2014-04-12 MED ORDER — ONDANSETRON HCL 4 MG/2ML IJ SOLN
INTRAMUSCULAR | Status: DC | PRN
  Administered 2014-04-12: 4 mg via INTRAVENOUS

## 2014-04-12 MED ORDER — CEFAZOLIN SODIUM-DEXTROSE 2-3 GM-% IV SOLR
2.0000 g | INTRAVENOUS | Status: AC
  Administered 2014-04-12: 2 g via INTRAVENOUS

## 2014-04-12 MED ORDER — KCL IN DEXTROSE-NACL 20-5-0.45 MEQ/L-%-% IV SOLN
INTRAVENOUS | Status: DC
  Administered 2014-04-12 – 2014-04-13 (×3): via INTRAVENOUS
  Filled 2014-04-12 (×5): qty 1000

## 2014-04-12 MED ORDER — KETOROLAC TROMETHAMINE 15 MG/ML IJ SOLN
15.0000 mg | Freq: Four times a day (QID) | INTRAMUSCULAR | Status: DC
  Administered 2014-04-12 – 2014-04-13 (×4): 15 mg via INTRAVENOUS
  Filled 2014-04-12 (×6): qty 1

## 2014-04-12 MED ORDER — NEBIVOLOL HCL 2.5 MG PO TABS
2.5000 mg | ORAL_TABLET | Freq: Every day | ORAL | Status: DC
  Filled 2014-04-12 (×2): qty 1

## 2014-04-12 MED ORDER — LIDOCAINE HCL (PF) 2 % IJ SOLN
INTRAMUSCULAR | Status: DC | PRN
  Administered 2014-04-12: 20 mg via INTRADERMAL

## 2014-04-12 MED ORDER — CEFAZOLIN SODIUM-DEXTROSE 2-3 GM-% IV SOLR
2.0000 g | Freq: Once | INTRAVENOUS | Status: AC
  Administered 2014-04-12: 2 g via INTRAVENOUS

## 2014-04-12 MED ORDER — LACTATED RINGERS IV SOLN
INTRAVENOUS | Status: DC | PRN
  Administered 2014-04-12 (×2): via INTRAVENOUS

## 2014-04-12 MED ORDER — PROPOFOL 10 MG/ML IV BOLUS
INTRAVENOUS | Status: AC
  Filled 2014-04-12: qty 20

## 2014-04-12 MED ORDER — DIPHENHYDRAMINE HCL 12.5 MG/5ML PO ELIX: 12.5000 mg | ORAL_SOLUTION | Freq: Four times a day (QID) | ORAL | Status: DC | PRN

## 2014-04-12 MED ORDER — DIPHENHYDRAMINE HCL 50 MG/ML IJ SOLN: 12.5000 mg | Freq: Four times a day (QID) | INTRAMUSCULAR | Status: DC | PRN

## 2014-04-12 MED ORDER — HYDROMORPHONE HCL 1 MG/ML IJ SOLN
0.2500 mg | INTRAMUSCULAR | Status: DC | PRN
  Administered 2014-04-12: 0.5 mg via INTRAVENOUS

## 2014-04-12 MED ORDER — BUPIVACAINE-EPINEPHRINE 0.25% -1:200000 IJ SOLN
INTRAMUSCULAR | Status: DC | PRN
  Administered 2014-04-12: 30 mL

## 2014-04-12 MED ORDER — HYDROMORPHONE HCL 2 MG/ML IJ SOLN
INTRAMUSCULAR | Status: AC
  Filled 2014-04-12: qty 1

## 2014-04-12 MED ORDER — OXYCODONE HCL 5 MG/5ML PO SOLN: 5.0000 mg | Freq: Once | ORAL | Status: DC | PRN

## 2014-04-12 MED ORDER — KCL IN DEXTROSE-NACL 20-5-0.9 MEQ/L-%-% IV SOLN
INTRAVENOUS | Status: AC
  Filled 2014-04-12: qty 1000

## 2014-04-12 MED ORDER — NEOSTIGMINE METHYLSULFATE 10 MG/10ML IV SOLN
INTRAVENOUS | Status: AC
  Filled 2014-04-12: qty 1

## 2014-04-12 MED ORDER — HEPARIN SODIUM (PORCINE) 1000 UNIT/ML IJ SOLN
INTRAMUSCULAR | Status: DC | PRN
  Administered 2014-04-12: 11:00:00

## 2014-04-12 MED ORDER — FENTANYL CITRATE 0.05 MG/ML IJ SOLN
INTRAMUSCULAR | Status: DC | PRN
  Administered 2014-04-12: 50 ug via INTRAVENOUS
  Administered 2014-04-12: 100 ug via INTRAVENOUS

## 2014-04-12 MED ORDER — OXYCODONE HCL 5 MG PO TABS: 5.0000 mg | ORAL_TABLET | Freq: Once | ORAL | Status: DC | PRN

## 2014-04-12 MED ORDER — PROMETHAZINE HCL 25 MG/ML IJ SOLN: 6.2500 mg | INTRAMUSCULAR | Status: DC | PRN

## 2014-04-12 MED ORDER — NEOSTIGMINE METHYLSULFATE 10 MG/10ML IV SOLN
INTRAVENOUS | Status: DC | PRN
  Administered 2014-04-12: 4 mg via INTRAVENOUS

## 2014-04-12 MED ORDER — ONDANSETRON HCL 4 MG/2ML IJ SOLN
4.0000 mg | Freq: Four times a day (QID) | INTRAMUSCULAR | Status: DC | PRN
  Administered 2014-04-12: 4 mg via INTRAVENOUS
  Filled 2014-04-12: qty 2

## 2014-04-12 MED ORDER — FENTANYL CITRATE 0.05 MG/ML IJ SOLN
INTRAMUSCULAR | Status: AC
  Filled 2014-04-12: qty 5

## 2014-04-12 MED ORDER — ROCURONIUM BROMIDE 100 MG/10ML IV SOLN
INTRAVENOUS | Status: AC
  Filled 2014-04-12: qty 1

## 2014-04-12 MED ORDER — MEPERIDINE HCL 50 MG/ML IJ SOLN: 6.2500 mg | INTRAMUSCULAR | Status: DC | PRN

## 2014-04-12 MED ORDER — HYDROCODONE-ACETAMINOPHEN 5-325 MG PO TABS: 1.0000 | ORAL_TABLET | Freq: Four times a day (QID) | ORAL | Status: DC | PRN

## 2014-04-12 MED ORDER — SUCCINYLCHOLINE CHLORIDE 20 MG/ML IJ SOLN
INTRAMUSCULAR | Status: DC | PRN
  Administered 2014-04-12: 100 mg via INTRAVENOUS

## 2014-04-12 MED ORDER — PNEUMOCOCCAL VAC POLYVALENT 25 MCG/0.5ML IJ INJ
0.5000 mL | INJECTION | INTRAMUSCULAR | Status: DC
  Filled 2014-04-12 (×2): qty 0.5

## 2014-04-12 MED ORDER — MORPHINE SULFATE 2 MG/ML IJ SOLN: 2.0000 mg | INTRAMUSCULAR | Status: DC | PRN

## 2014-04-12 MED ORDER — CIPROFLOXACIN HCL 500 MG PO TABS: 500.0000 mg | ORAL_TABLET | Freq: Two times a day (BID) | ORAL | Status: DC

## 2014-04-12 MED ORDER — MIDAZOLAM HCL 2 MG/2ML IJ SOLN
INTRAMUSCULAR | Status: AC
  Filled 2014-04-12: qty 2

## 2014-04-12 MED ORDER — HYDROMORPHONE HCL 1 MG/ML IJ SOLN
INTRAMUSCULAR | Status: AC
  Filled 2014-04-12: qty 1

## 2014-04-12 MED ORDER — LIDOCAINE HCL (CARDIAC) 20 MG/ML IV SOLN
INTRAVENOUS | Status: AC
  Filled 2014-04-12: qty 5

## 2014-04-12 SURGICAL SUPPLY — 53 items
ADH SKN CLS APL DERMABOND .7 (GAUZE/BANDAGES/DRESSINGS)
CABLE HIGH FREQUENCY MONO STRZ (ELECTRODE) ×4 IMPLANT
CATH FOLEY 2WAY SLVR 18FR 30CC (CATHETERS) ×4 IMPLANT
CATH ROBINSON RED A/P 16FR (CATHETERS) ×4 IMPLANT
CATH ROBINSON RED A/P 8FR (CATHETERS) ×4 IMPLANT
CATH TIEMANN FOLEY 18FR 5CC (CATHETERS) ×4 IMPLANT
CHLORAPREP W/TINT 26ML (MISCELLANEOUS) ×4 IMPLANT
CLIP LIGATING HEM O LOK PURPLE (MISCELLANEOUS) ×8 IMPLANT
CLOTH BEACON ORANGE TIMEOUT ST (SAFETY) ×4 IMPLANT
COVER SURGICAL LIGHT HANDLE (MISCELLANEOUS) ×4 IMPLANT
COVER TIP SHEARS 8 DVNC (MISCELLANEOUS) ×2 IMPLANT
COVER TIP SHEARS 8MM DA VINCI (MISCELLANEOUS) ×2
CUTTER ECHEON FLEX ENDO 45 340 (ENDOMECHANICALS) ×4 IMPLANT
DECANTER SPIKE VIAL GLASS SM (MISCELLANEOUS) ×2 IMPLANT
DERMABOND ADVANCED (GAUZE/BANDAGES/DRESSINGS)
DERMABOND ADVANCED .7 DNX12 (GAUZE/BANDAGES/DRESSINGS) IMPLANT
DRAPE SURG IRRIG POUCH 19X23 (DRAPES) ×4 IMPLANT
DRSG TEGADERM 4X4.75 (GAUZE/BANDAGES/DRESSINGS) ×4 IMPLANT
DRSG TEGADERM 6X8 (GAUZE/BANDAGES/DRESSINGS) ×8 IMPLANT
ELECT REM PT RETURN 9FT ADLT (ELECTROSURGICAL) ×4
ELECTRODE REM PT RTRN 9FT ADLT (ELECTROSURGICAL) ×2 IMPLANT
GLOVE BIO SURGEON STRL SZ 6.5 (GLOVE) ×3 IMPLANT
GLOVE BIO SURGEONS STRL SZ 6.5 (GLOVE) ×1
GLOVE BIOGEL M STRL SZ7.5 (GLOVE) ×8 IMPLANT
GOWN STRL REUS W/TWL LRG LVL3 (GOWN DISPOSABLE) ×12 IMPLANT
HOLDER FOLEY CATH W/STRAP (MISCELLANEOUS) ×4 IMPLANT
IV LACTATED RINGERS 1000ML (IV SOLUTION) ×4 IMPLANT
KIT ACCESSORY DA VINCI DISP (KITS) ×2
KIT ACCESSORY DVNC DISP (KITS) ×2 IMPLANT
MANIFOLD NEPTUNE II (INSTRUMENTS) ×4 IMPLANT
NDL SAFETY ECLIPSE 18X1.5 (NEEDLE) ×2 IMPLANT
NEEDLE HYPO 18GX1.5 SHARP (NEEDLE) ×4
PACK ROBOT UROLOGY CUSTOM (CUSTOM PROCEDURE TRAY) ×4 IMPLANT
RELOAD GREEN ECHELON 45 (STAPLE) ×4 IMPLANT
SET TUBE IRRIG SUCTION NO TIP (IRRIGATION / IRRIGATOR) ×4 IMPLANT
SOLUTION ELECTROLUBE (MISCELLANEOUS) ×4 IMPLANT
SUT ETHILON 3 0 PS 1 (SUTURE) ×4 IMPLANT
SUT MNCRL 3 0 RB1 (SUTURE) ×2 IMPLANT
SUT MNCRL 3 0 VIOLET RB1 (SUTURE) ×2 IMPLANT
SUT MNCRL AB 4-0 PS2 18 (SUTURE) ×8 IMPLANT
SUT MONOCRYL 3 0 RB1 (SUTURE) ×6
SUT VIC AB 0 CT1 27 (SUTURE) ×4
SUT VIC AB 0 CT1 27XBRD ANTBC (SUTURE) ×2 IMPLANT
SUT VIC AB 0 UR5 27 (SUTURE) ×4 IMPLANT
SUT VIC AB 2-0 SH 27 (SUTURE) ×4
SUT VIC AB 2-0 SH 27X BRD (SUTURE) ×2 IMPLANT
SUT VIC AB 3-0 SH 27 (SUTURE) ×12
SUT VIC AB 3-0 SH 27X BRD (SUTURE) IMPLANT
SUT VICRYL 0 UR6 27IN ABS (SUTURE) ×8 IMPLANT
SYR 27GX1/2 1ML LL SAFETY (SYRINGE) ×4 IMPLANT
TOWEL OR 17X26 10 PK STRL BLUE (TOWEL DISPOSABLE) ×4 IMPLANT
TOWEL OR NON WOVEN STRL DISP B (DISPOSABLE) ×4 IMPLANT
WATER STERILE IRR 1500ML POUR (IV SOLUTION) ×8 IMPLANT

## 2014-04-12 NOTE — Anesthesia Postprocedure Evaluation (Signed)
Anesthesia Post Note  Patient: Andrew Rogers  Procedure(s) Performed: Procedure(s) (LRB): ROBOTIC ASSISTED LAPAROSCOPIC RADICAL PROSTATECTOMY (LEVEL 2) (N/A) BILATERAL LYMPHADENECTOMY (Bilateral)  Anesthesia type: General  Patient location: PACU  Post pain: Pain level controlled  Post assessment: Post-op Vital signs reviewed  Last Vitals: BP 179/75  Pulse 50  Temp(Src) 36.3 C (Oral)  Resp 16  Ht 5\' 11"  (1.803 m)  Wt 216 lb (97.977 kg)  BMI 30.14 kg/m2  SpO2 100%  Post vital signs: Reviewed  Level of consciousness: sedated  Complications: No apparent anesthesia complications

## 2014-04-12 NOTE — Progress Notes (Signed)
Patient ID: Andrew Rogers, male   DOB: 1945/10/10, 68 y.o.   MRN: [id number] Post-op note  Subjective: The patient is doing well.  No complaints.  Still very sleepy.  Needs CPAP  Objective: Vital signs in last 24 hours: Temp:  [97.4 F (36.3 C)-97.6 F (36.4 C)] 97.4 F (36.3 C) (09/17 1300) Pulse Rate:  [50-64] 50 (09/17 1300) Resp:  [14-19] 16 (09/17 1300) BP: (145-179)/(73-92) 179/75 mmHg (09/17 1300) SpO2:  [98 %-100 %] 100 % (09/17 1300) Weight:  [97.977 kg (216 lb)] 97.977 kg (216 lb) (09/17 0511)  Intake/Output from previous day:   Intake/Output this shift: Total I/O In: 2700 [I.V.:1700; IV Piggyback:1000] Out: 790 [Drains:40; Blood:750]  Physical Exam:  General: Alert and oriented. Abdomen: Soft, Nondistended. Incisions: Clean and dry. Urine: red  Lab Results:  Recent Labs  04/12/14 1201  HGB 13.3  HCT 39.1    Assessment/Plan: POD#0   1) Continue to monitor  2) DVT prophy, clears, IS, amb, pain control  3) Asked RN to arrange CPAP for pt as he did not bring his from home    LOS: 0 days   [place], [place] 04/12/2014, 2:15 PM

## 2014-04-12 NOTE — Care Management Note (Addendum)
    Page 1 of 1   04/13/2014     1:05:42 PM CARE MANAGEMENT NOTE 04/13/2014  Patient:  LATONYA, KNIGHT   Account Number:  [id number]  Date Initiated:  04/12/2014  Documentation initiated by:  Dessa Phi  Subjective/Objective Assessment:   68 Y/O M ADMITTED W/PROSTATE CA.     Action/Plan:   FROM HOME.   Anticipated DC Date:  04/13/2014   Anticipated DC Plan:  [place]  CM consult      Choice offered to / List presented to:             Status of service:  In process, will continue to follow Medicare Important Message given?   (If response is "NO", the following Medicare IM given date fields will be blank) Date Medicare IM given:   Medicare IM given by:   Date Additional Medicare IM given:   Additional Medicare IM given by:    Discharge Disposition:  HOME/SELF CARE  Per UR Regulation:  Reviewed for med. necessity/level of care/duration of stay  If discussed at [place] of Stay Meetings, dates discussed:    Comments:  04/13/14 Iain Sawchuk RN,BSN NCM [phone] D/C HOME NO [place].  04/12/14 Darrie Macmillan RN,BSN NCM [phone] S/P LAP RAD PROSTATECTOMY.NO ANTICIPATED D/C NEEDS.

## 2014-04-12 NOTE — Discharge Instructions (Signed)

## 2014-04-12 NOTE — Progress Notes (Signed)
Patient ID: GALO SAYED, male   DOB: May 03, 1946, 68 y.o.   MRN: [id number]  Post-op note  Subjective: The patient is doing well.  No complaints.  Objective: Vital signs in last 24 hours: Temp:  [97.4 F (36.3 C)-97.6 F (36.4 C)] 97.4 F (36.3 C) (09/17 1300) Pulse Rate:  [50-64] 50 (09/17 1300) Resp:  [14-19] 16 (09/17 1300) BP: (145-179)/(73-92) 179/75 mmHg (09/17 1300) SpO2:  [98 %-100 %] 100 % (09/17 1300) Weight:  [97.977 kg (216 lb)] 97.977 kg (216 lb) (09/17 0511)  Intake/Output from previous day:   Intake/Output this shift: Total I/O In: 2700 [I.V.:1700; IV Piggyback:1000] Out: 790 [Drains:40; Blood:750]  Physical Exam:  General: Alert and oriented. Abdomen: Soft, Nondistended. Incisions: Clean and dry. Urine: Pink and draining well.  Lab Results:  Recent Labs  04/12/14 1201  HGB 13.3  HCT 39.1    Assessment/Plan: POD#0   1) Continue to monitor   Pryor Curia. MD   LOS: 0 days   Ahad Colarusso,LES 04/12/2014, 2:37 PM

## 2014-04-12 NOTE — Progress Notes (Signed)
Utilization review completed.  

## 2014-04-12 NOTE — Progress Notes (Signed)
Spoke with pt regarding cpap.  Pt stated he wants to wear cpap around 2300.  This RT will be back to assist pt at that time.  RN aware.

## 2014-04-12 NOTE — Op Note (Signed)
Preoperative diagnosis: Clinically localized adenocarcinoma of the prostate (clinical stage T1c Nx Mx)  Postoperative diagnosis: Clinically localized adenocarcinoma of the prostate (clinical stage T1c Nx Mx)  Procedure:  1. Robotic assisted laparoscopic radical prostatectomy (bilateral nerve sparing) 2. Bilateral robotic assisted laparoscopic pelvic lymphadenectomy  Surgeon: Pryor Curia. M.D.  Assistant: Felipa Furnace) [place], [place]  Resident: Dr. Milly Jakob  Anesthesia: General  Complications: None  EBL: 300 mL  IVF:  2200 mL crystalloid  Specimens: 1. Prostate and seminal vesicles 2. Right pelvic lymph nodes 3. Left pelvic lymph nodes  Disposition of specimens: Pathology  Drains: 1. 20 Fr coude catheter 2. # 19 Blake pelvic drain  Indication: Andrew Rogers is a 68 y.o. year old patient with clinically localized prostate cancer.  After a thorough review of the management options for treatment of prostate cancer, he elected to proceed with surgical therapy and the above procedure(s).  We have discussed the potential benefits and risks of the procedure, side effects of the proposed treatment, the likelihood of the patient achieving the goals of the procedure, and any potential problems that might occur during the procedure or recuperation. Informed consent has been obtained.  Description of procedure:  The patient was taken to the operating room and a general anesthetic was administered. He was given preoperative antibiotics, placed in the dorsal lithotomy position, and prepped and draped in the usual sterile fashion. Next a preoperative timeout was performed. A urethral catheter was placed into the bladder and a site was selected near the umbilicus for placement of the camera port. This was placed using a standard open Hassan technique which allowed entry into the peritoneal cavity under direct vision and without difficulty. A 12 mm port was placed and a  pneumoperitoneum established. The camera was then used to inspect the abdomen and there was no evidence of any intra-abdominal injuries or other abnormalities. The remaining abdominal ports were then placed. 8 mm robotic ports were placed in the right lower quadrant, left lower quadrant, and far left lateral abdominal wall. A 5 mm port was placed in the right upper quadrant and a 12 mm port was placed in the right lateral abdominal wall for laparoscopic assistance. All ports were placed under direct vision without difficulty. The surgical cart was then docked.   Utilizing the cautery scissors, the bladder was reflected posteriorly allowing entry into the space of Retzius and identification of the endopelvic fascia and prostate. The periprostatic fat was then removed from the prostate allowing full exposure of the endopelvic fascia. The endopelvic fascia was then incised from the apex back to the base of the prostate bilaterally and the underlying levator muscle fibers were swept laterally off the prostate thereby isolating the dorsal venous complex. The dorsal vein was then stapled and divided with a 45 mm Flex Echelon stapler. Attention then turned to the bladder neck which was divided anteriorly thereby allowing entry into the bladder and exposure of the urethral catheter. The catheter balloon was deflated and the catheter was brought into the operative field and used to retract the prostate anteriorly. The posterior bladder neck was then examined.  There was a moderate size median lobe.  The posterior bladder neck was divided between the median lobe and bladder neck allowing further dissection between the bladder and prostate posteriorly until the vasa deferentia and seminal vessels were identified. The vasa deferentia were isolated, divided, and lifted anteriorly. The seminal vesicles were dissected down to their tips with care to control the seminal  vascular arterial blood supply. These structures were then  lifted anteriorly and the space between Denonvillier's fascia and the anterior rectum was developed with a combination of sharp and blunt dissection.  The dissection laterally was extremely desmoplastic likely a result of his multiple prior prostate biopsies.  This required tedious sharp dissection although the prostate was not violated. This isolated the vascular pedicles of the prostate.  The lateral prostatic fascia was then sharply incised allowing release of the neurovascular bundles bilaterally. The vascular pedicles of the prostate were then ligated with Weck clips between the prostate and neurovascular bundles and divided with sharp cold scissor dissection resulting in neurovascular bundle preservation. The neurovascular bundles were then separated off the apex of the prostate and urethra bilaterally.  The urethra was then sharply transected allowing the prostate specimen to be disarticulated. The pelvis was copiously irrigated and hemostasis was ensured. There was no evidence for rectal injury.  Attention then turned to the right pelvic sidewall. The fibrofatty tissue between the external iliac vein, confluence of the iliac vessels, hypogastric artery, and Cooper's ligament was dissected free from the pelvic sidewall with care to preserve the obturator nerve. Weck clips were used for lymphostasis and hemostasis. An identical procedure was performed on the contralateral side and the lymphatic packets were removed for permanent pathologic analysis.  Attention then turned to the urethral anastomosis. A 2-0 Vicryl slip knot was placed between Denonvillier's fascia, the posterior bladder neck, and the posterior urethra to reapproximate these structures. A double-armed 3-0 Monocryl suture was then used to perform a 360 running tension-free anastomosis between the bladder neck and urethra. A new urethral catheter was then placed into the bladder and irrigated. There were no blood clots within the bladder  and the anastomosis appeared to be watertight. A #19 Blake drain was then brought through the left lateral 8 mm port site and positioned appropriately within the pelvis. It was secured to the skin with a nylon suture. The surgical cart was then undocked. The right lateral 12 mm port site was closed at the fascial level with a 0 Vicryl suture placed laparoscopically. All remaining ports were then removed under direct vision. The prostate specimen was removed intact within the Endopouch retrieval bag via the periumbilical camera port site. This fascial opening was closed with two running 0 Vicryl sutures. 0.25% Marcaine was then injected into all port sites and all incisions were reapproximated at the skin level with 4-0 Monocryl subcuticular sutures and Dermabond. The patient appeared to tolerate the procedure well and without complications. The patient was able to be extubated and transferred to the recovery unit in satisfactory condition.   Pryor Curia MD

## 2014-04-12 NOTE — Transfer of Care (Signed)
Immediate Anesthesia Transfer of Care Note  Patient: Andrew Rogers  Procedure(s) Performed: Procedure(s): ROBOTIC ASSISTED LAPAROSCOPIC RADICAL PROSTATECTOMY (LEVEL 2) (N/A) BILATERAL LYMPHADENECTOMY (Bilateral)  Patient Location: PACU  Anesthesia Type:General  Level of Consciousness: awake  Airway & Oxygen Therapy: Patient Spontanous Breathing and Patient connected to face mask oxygen  Post-op Assessment: Report given to PACU RN and Post -op Vital signs reviewed and stable  Post vital signs: Reviewed and stable  Complications: No apparent anesthesia complications

## 2014-04-13 ENCOUNTER — Encounter (HOSPITAL_COMMUNITY): Payer: Self-pay | Admitting: Urology

## 2014-04-13 LAB — HEMOGLOBIN AND HEMATOCRIT, BLOOD
HCT: 33.7 % — ABNORMAL LOW (ref 39.0–52.0)
HEMATOCRIT: 32.7 % — AB (ref 39.0–52.0)
HEMOGLOBIN: 11.6 g/dL — AB (ref 13.0–17.0)
Hemoglobin: 11.2 g/dL — ABNORMAL LOW (ref 13.0–17.0)

## 2014-04-13 MED ORDER — HYDROCODONE-ACETAMINOPHEN 5-325 MG PO TABS: 1.0000 | ORAL_TABLET | Freq: Four times a day (QID) | ORAL | Status: DC | PRN

## 2014-04-13 MED ORDER — OXYCODONE-ACETAMINOPHEN 5-325 MG PO TABS: 1.0000 | ORAL_TABLET | ORAL | Status: DC | PRN

## 2014-04-13 MED ORDER — BISACODYL 10 MG RE SUPP
10.0000 mg | Freq: Once | RECTAL | Status: AC
  Administered 2014-04-13: 10 mg via RECTAL
  Filled 2014-04-13: qty 1

## 2014-04-13 NOTE — Discharge Summary (Signed)
  Date of admission: 04/12/2014  Date of discharge: 04/13/2014  Admission diagnosis: Prostate Cancer  Discharge diagnosis: Prostate Cancer  History and Physical: For full details, please see admission history and physical. Briefly, Andrew Rogers is a 68 y.o. gentleman with localized prostate cancer.  After discussing management/treatment options, he elected to proceed with surgical treatment.  Hospital Course: Andrew Rogers was taken to the operating room on 04/12/2014 and underwent a robotic assisted laparoscopic radical prostatectomy. He tolerated this procedure well and without complications. Postoperatively, he was able to be transferred to a regular hospital room following recovery from anesthesia.  He was able to begin ambulating the night of surgery. He remained hemodynamically stable overnight.  He had excellent urine output with appropriately minimal output from his pelvic drain and his pelvic drain was removed on POD #1.  He was transitioned to oral pain medication, tolerated a clear liquid diet, and had met all discharge criteria and was able to be discharged home later on POD#1.  Laboratory values:  Recent Labs  04/12/14 1201 04/13/14 0424 04/13/14 1121  HGB 13.3 11.6* 11.2*  HCT 39.1 33.7* 32.7*    Disposition: Home  Discharge instruction: He was instructed to be ambulatory but to refrain from heavy lifting, strenuous activity, or driving. He was instructed on urethral catheter care.  Discharge medications:     Medication List    STOP taking these medications       cholecalciferol 1000 UNITS tablet  Commonly known as:  VITAMIN D     magnesium oxide 400 MG tablet  Commonly known as:  MAG-OX     Potassium 99 MG Tabs     vitamin C 500 MG tablet  Commonly known as:  ASCORBIC ACID      TAKE these medications       ciprofloxacin 500 MG tablet  Commonly known as:  CIPRO  Take 1 tablet (500 mg total) by mouth 2 (two) times daily. Start day prior to office visit for  foley removal     clobetasol cream 0.05 %  Commonly known as:  TEMOVATE  Apply 1 application topically 2 (two) times daily as needed (for rash).     hydrALAZINE 25 MG tablet  Commonly known as:  APRESOLINE  Take 25 mg by mouth 2 (two) times daily.     HYDROcodone-acetaminophen 5-325 MG per tablet  Commonly known as:  NORCO  Take 1-2 tablets by mouth every 6 (six) hours as needed.     nebivolol 5 MG tablet  Commonly known as:  BYSTOLIC  Take 2.5 mg by mouth. TAKE 1/2 OF 5 MG TABLET AT BEDTIME IF B/P GREATER THAN 140/90 AND IF PULSE NOT LESS THAN 50.    THIS NEW B/P MEDICATION JUST STARTING TODAY 04/09/14.        Followup: He will followup in 1 week for catheter removal and to discuss his surgical pathology results.

## 2014-04-13 NOTE — Progress Notes (Signed)
1 Day Post-Op Subjective: The patient is doing well.  No nausea or vomiting. No pain. Ambulated x 2 in halls. Tolerating clears.  Objective: Vital signs in last 24 hours: Temp:  [97.4 F (36.3 C)-98.7 F (37.1 C)] 98.5 F (36.9 C) (09/18 0454) Pulse Rate:  [48-70] 56 (09/18 0454) Resp:  [14-19] 14 (09/18 0454) BP: (128-179)/(62-92) 128/62 mmHg (09/18 0454) SpO2:  [97 %-100 %] 98 % (09/18 0454)  Intake/Output from previous day: 09/17 0701 - 09/18 0700 In: 5605 [P.O.:600; I.V.:3905; IV Piggyback:1100] Out: 2975 [Urine:2075; Drains:150; Blood:750] Intake/Output this shift:  Drain put out 110 in last 12 hours (10 in last 4 hours)  Physical Exam:  General: Alert and oriented. CV: RRR Lungs: Clear bilaterally. GI: Soft, Nondistended. Incisions: Clean, dry, and intact Urine: Clear Extremities: Nontender, no erythema, no edema.  Lab Results:  Recent Labs  04/12/14 1201 04/13/14 0424  HGB 13.3 11.6*  HCT 39.1 33.7*      Assessment/Plan: POD# 1 s/p robotic prostatectomy.  1) SL IVF 2) Ambulate, Incentive spirometry 3) Transition to oral pain medication 4) Dulcolax suppository 5) D/C pelvic drain 6) Plan for likely discharge later today

## 2015-02-21 DIAGNOSIS — I129 Hypertensive chronic kidney disease with stage 1 through stage 4 chronic kidney disease, or unspecified chronic kidney disease: Secondary | ICD-10-CM | POA: Insufficient documentation

## 2015-08-15 IMAGING — CR DG CHEST 2V
2 series · 2 of 2 positions shown · non-contrast
Comparison: None.

CLINICAL DATA: Preop for prostatectomy

EXAM:
CHEST  2 VIEW

[w chest pa]
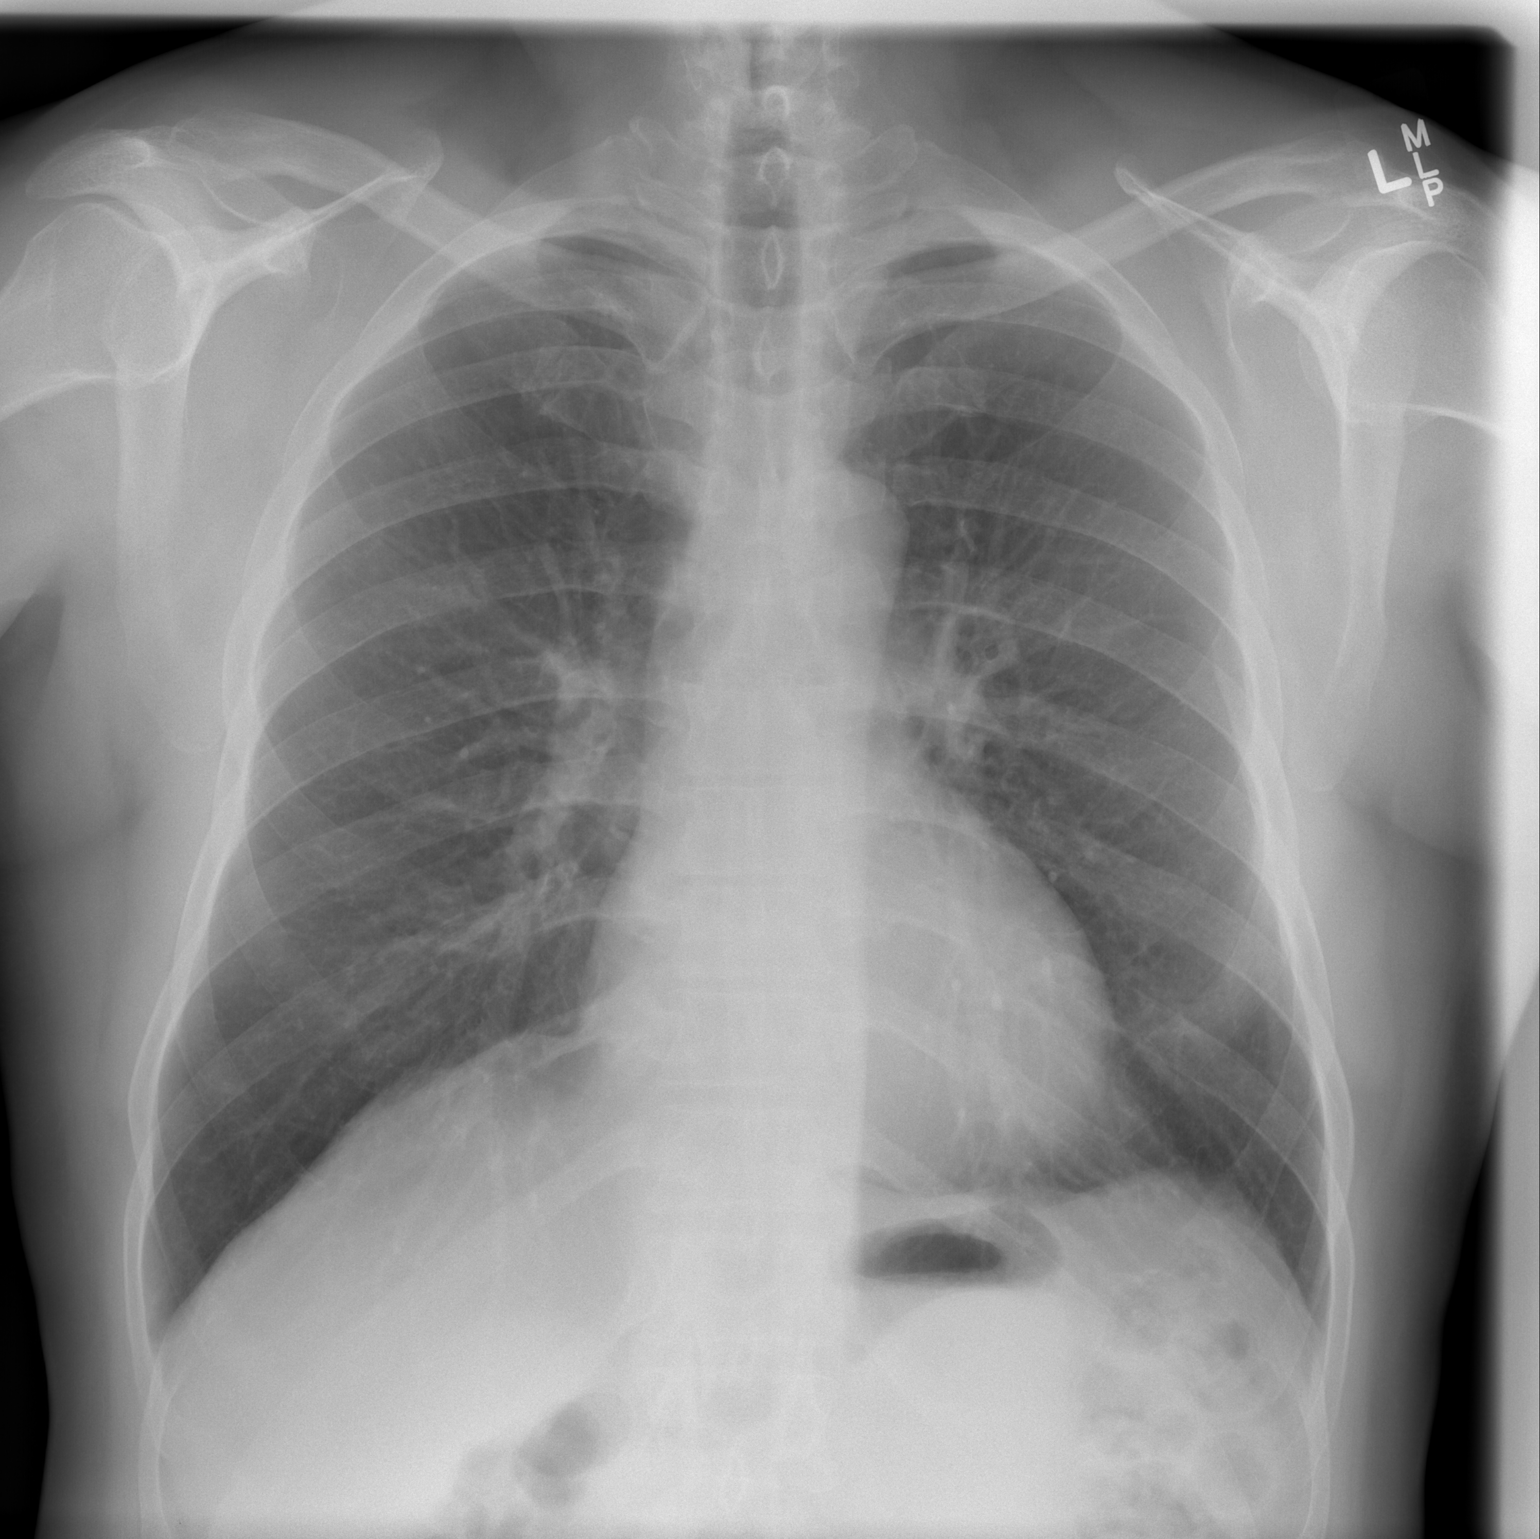

[w chest lat]
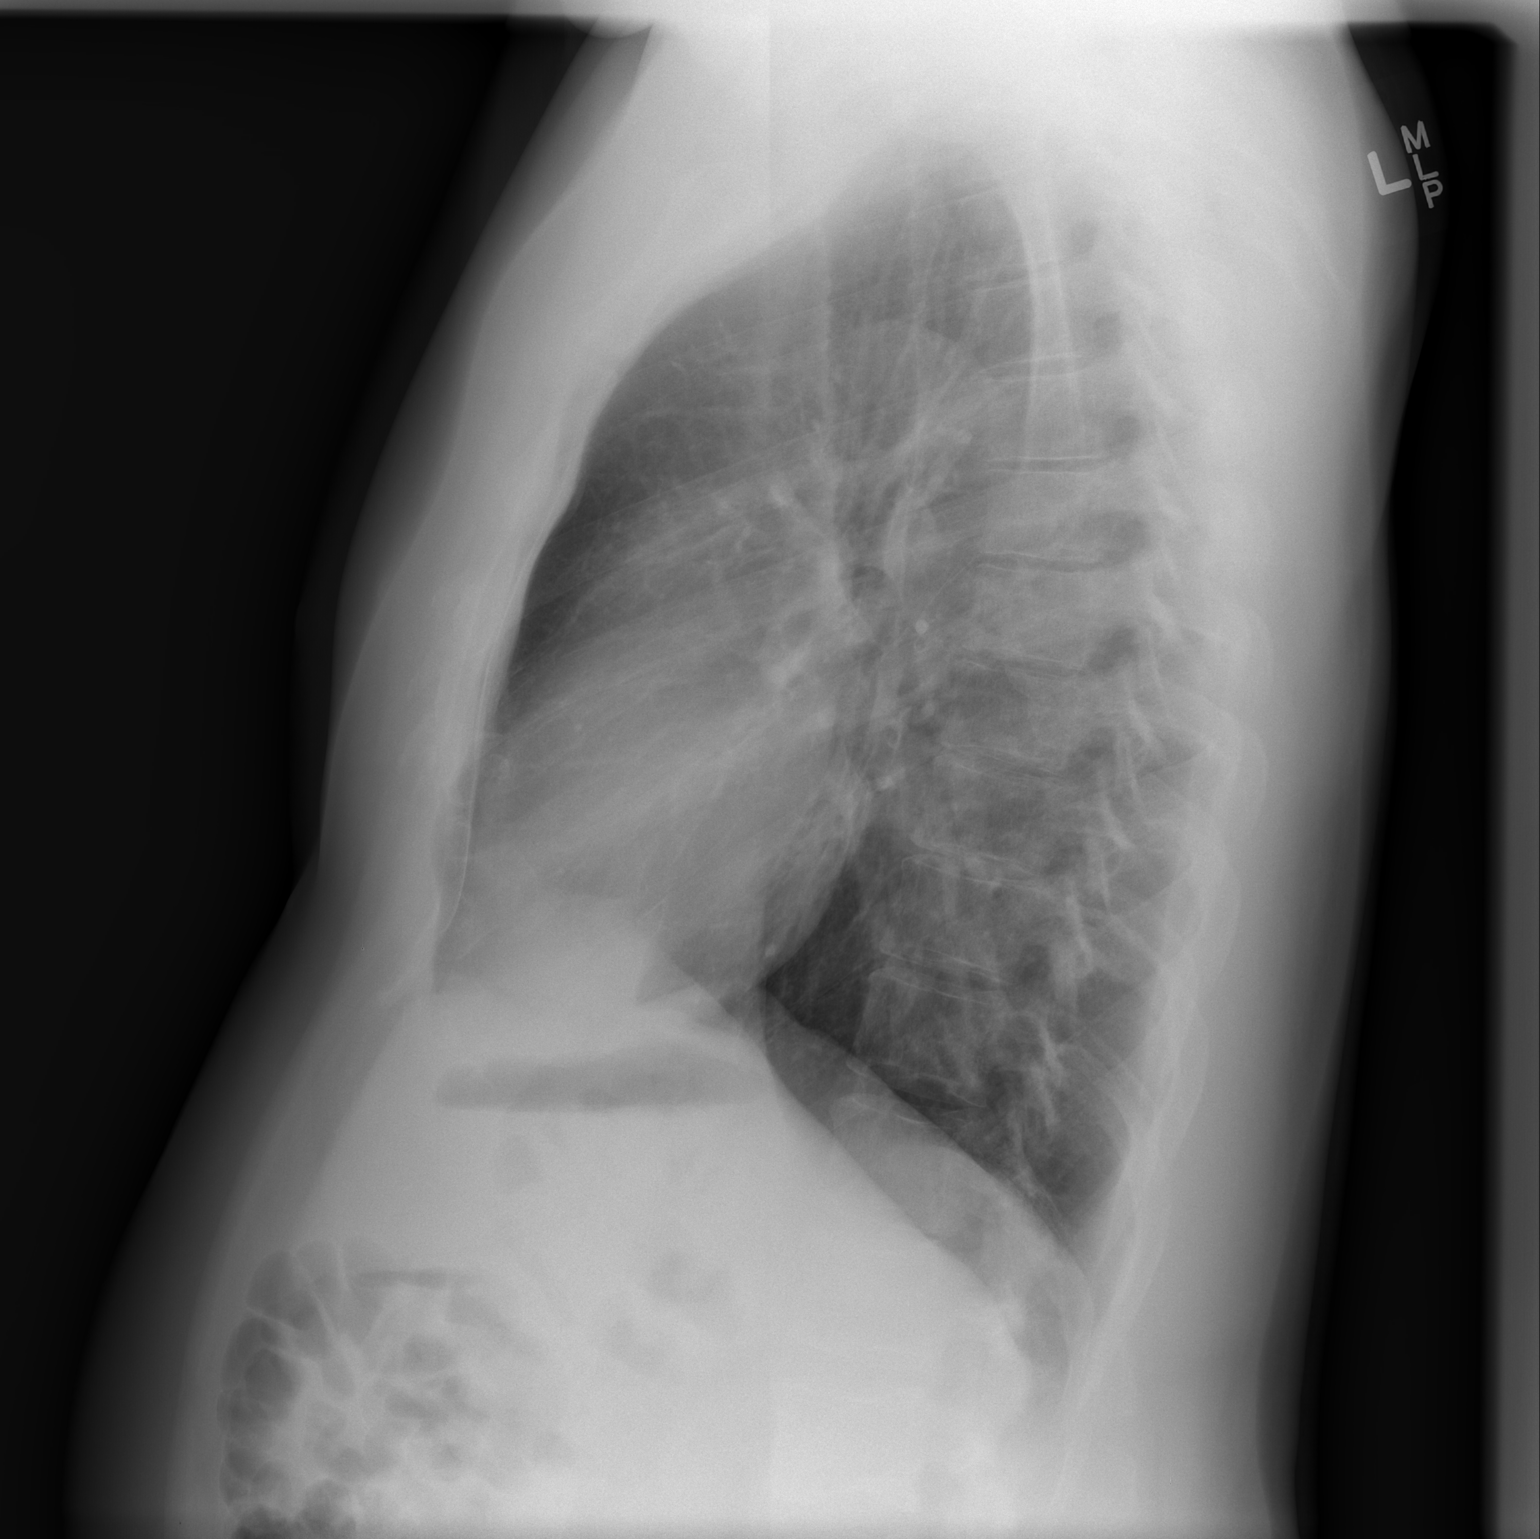

[2 of 2 positions shown; findings below may reference images not displayed]

FINDINGS: Cardiomediastinal silhouette is stable. No acute infiltrate or
pleural effusion. No pulmonary edema. Bony thorax is unremarkable.
IMPRESSION: No active cardiopulmonary disease.

## 2017-07-01 DIAGNOSIS — R001 Bradycardia, unspecified: Secondary | ICD-10-CM | POA: Insufficient documentation

## 2019-08-18 ENCOUNTER — Ambulatory Visit: Payer: Medicare PPO | Attending: Internal Medicine

## 2019-08-18 DIAGNOSIS — Z23 Encounter for immunization: Secondary | ICD-10-CM | POA: Insufficient documentation

## 2019-08-18 NOTE — Progress Notes (Signed)
   Covid-19 Vaccination Clinic  Name:  Andrew Rogers    MRN: [id number] DOB: 1946-02-03  08/18/2019  [name] was observed post Covid-19 immunization for 30 minutes based on pre-vaccination screening without incidence. He was provided with Vaccine Information Sheet and instruction to access the V-Safe system.   [name] was instructed to call 911 with any severe reactions post vaccine: Marland Kitchen Difficulty breathing  . Swelling of your face and throat  . A fast heartbeat  . A bad rash all over your body  . Dizziness and weakness    Immunizations Administered    Name Date Dose VIS Date Route   Pfizer COVID-19 Vaccine 08/18/2019  9:08 AM 0.3 mL 07/07/2019 Intramuscular   Manufacturer: [place]   Lot: [id number]   [place]: [id number]

## 2019-09-08 ENCOUNTER — Ambulatory Visit: Payer: Medicare PPO | Attending: Internal Medicine

## 2019-09-08 DIAGNOSIS — Z23 Encounter for immunization: Secondary | ICD-10-CM | POA: Insufficient documentation

## 2019-09-08 NOTE — Progress Notes (Signed)
   Covid-19 Vaccination Clinic  Name:  SKYLER SCHRAMEL    MRN: [id number] DOB: May 28, 1946  09/08/2019  Mr. Herrera was observed post Covid-19 immunization for 15 minutes without incidence. He was provided with Vaccine Information Sheet and instruction to access the V-Safe system.   Mr. Hebda was instructed to call 911 with any severe reactions post vaccine: Marland Kitchen Difficulty breathing  . Swelling of your face and throat  . A fast heartbeat  . A bad rash all over your body  . Dizziness and weakness    Immunizations Administered    Name Date Dose VIS Date Route   Pfizer COVID-19 Vaccine 09/08/2019  9:09 AM 0.3 mL 07/07/2019 Intramuscular   Manufacturer: [place]   Lot: [id number]   [place]: [id number]

## 2020-05-01 DIAGNOSIS — Z8546 Personal history of malignant neoplasm of prostate: Secondary | ICD-10-CM | POA: Diagnosis not present

## 2020-05-08 DIAGNOSIS — Z8546 Personal history of malignant neoplasm of prostate: Secondary | ICD-10-CM | POA: Diagnosis not present

## 2020-05-08 DIAGNOSIS — N5201 Erectile dysfunction due to arterial insufficiency: Secondary | ICD-10-CM | POA: Diagnosis not present

## 2020-05-11 ENCOUNTER — Ambulatory Visit: Payer: Medicare PPO | Attending: Internal Medicine

## 2020-05-11 DIAGNOSIS — Z8546 Personal history of malignant neoplasm of prostate: Secondary | ICD-10-CM | POA: Diagnosis not present

## 2020-05-11 DIAGNOSIS — Z23 Encounter for immunization: Secondary | ICD-10-CM

## 2020-05-11 NOTE — Progress Notes (Signed)
   Covid-19 Vaccination Clinic  Name:  Andrew Rogers    MRN: [id number] DOB: 03-19-1946  05/11/2020  [name] was observed post Covid-19 immunization for 15 minutes without incident. He was provided with Vaccine Information Sheet and instruction to access the V-Safe system.   [name] was instructed to call 911 with any severe reactions post vaccine: Marland Kitchen Difficulty breathing  . Swelling of face and throat  . A fast heartbeat  . A bad rash all over body  . Dizziness and weakness

## 2020-05-27 DIAGNOSIS — Z1152 Encounter for screening for COVID-19: Secondary | ICD-10-CM | POA: Diagnosis not present

## 2020-05-27 DIAGNOSIS — J069 Acute upper respiratory infection, unspecified: Secondary | ICD-10-CM | POA: Diagnosis not present

## 2020-05-27 DIAGNOSIS — R059 Cough, unspecified: Secondary | ICD-10-CM | POA: Diagnosis not present

## 2020-05-27 DIAGNOSIS — I10 Essential (primary) hypertension: Secondary | ICD-10-CM | POA: Diagnosis not present

## 2020-05-27 DIAGNOSIS — J370 Chronic laryngitis: Secondary | ICD-10-CM | POA: Diagnosis not present

## 2020-09-09 DIAGNOSIS — I10 Essential (primary) hypertension: Secondary | ICD-10-CM | POA: Diagnosis not present

## 2020-09-09 DIAGNOSIS — Z125 Encounter for screening for malignant neoplasm of prostate: Secondary | ICD-10-CM | POA: Diagnosis not present

## 2020-09-16 DIAGNOSIS — Z0000 Encounter for general adult medical examination without abnormal findings: Secondary | ICD-10-CM | POA: Diagnosis not present

## 2020-09-16 DIAGNOSIS — R82998 Other abnormal findings in urine: Secondary | ICD-10-CM | POA: Diagnosis not present

## 2020-09-16 DIAGNOSIS — N181 Chronic kidney disease, stage 1: Secondary | ICD-10-CM | POA: Diagnosis not present

## 2020-09-16 DIAGNOSIS — Z1331 Encounter for screening for depression: Secondary | ICD-10-CM | POA: Diagnosis not present

## 2020-09-16 DIAGNOSIS — R209 Unspecified disturbances of skin sensation: Secondary | ICD-10-CM | POA: Diagnosis not present

## 2020-09-16 DIAGNOSIS — C61 Malignant neoplasm of prostate: Secondary | ICD-10-CM | POA: Diagnosis not present

## 2020-09-16 DIAGNOSIS — E669 Obesity, unspecified: Secondary | ICD-10-CM | POA: Diagnosis not present

## 2020-09-16 DIAGNOSIS — R1013 Epigastric pain: Secondary | ICD-10-CM | POA: Diagnosis not present

## 2020-09-16 DIAGNOSIS — Z1212 Encounter for screening for malignant neoplasm of rectum: Secondary | ICD-10-CM | POA: Diagnosis not present

## 2020-09-16 DIAGNOSIS — I129 Hypertensive chronic kidney disease with stage 1 through stage 4 chronic kidney disease, or unspecified chronic kidney disease: Secondary | ICD-10-CM | POA: Diagnosis not present

## 2020-09-16 DIAGNOSIS — G4733 Obstructive sleep apnea (adult) (pediatric): Secondary | ICD-10-CM | POA: Diagnosis not present

## 2020-09-16 DIAGNOSIS — Z1339 Encounter for screening examination for other mental health and behavioral disorders: Secondary | ICD-10-CM | POA: Diagnosis not present

## 2020-09-16 DIAGNOSIS — R32 Unspecified urinary incontinence: Secondary | ICD-10-CM | POA: Diagnosis not present

## 2021-01-14 DIAGNOSIS — M1990 Unspecified osteoarthritis, unspecified site: Secondary | ICD-10-CM | POA: Diagnosis not present

## 2021-01-14 DIAGNOSIS — N181 Chronic kidney disease, stage 1: Secondary | ICD-10-CM | POA: Diagnosis not present

## 2021-01-14 DIAGNOSIS — E669 Obesity, unspecified: Secondary | ICD-10-CM | POA: Diagnosis not present

## 2021-01-14 DIAGNOSIS — L562 Photocontact dermatitis [berloque dermatitis]: Secondary | ICD-10-CM | POA: Diagnosis not present

## 2021-01-14 DIAGNOSIS — R1013 Epigastric pain: Secondary | ICD-10-CM | POA: Diagnosis not present

## 2021-01-14 DIAGNOSIS — I129 Hypertensive chronic kidney disease with stage 1 through stage 4 chronic kidney disease, or unspecified chronic kidney disease: Secondary | ICD-10-CM | POA: Diagnosis not present

## 2021-01-14 DIAGNOSIS — R809 Proteinuria, unspecified: Secondary | ICD-10-CM | POA: Diagnosis not present

## 2021-01-14 DIAGNOSIS — C61 Malignant neoplasm of prostate: Secondary | ICD-10-CM | POA: Diagnosis not present

## 2021-01-14 DIAGNOSIS — R209 Unspecified disturbances of skin sensation: Secondary | ICD-10-CM | POA: Diagnosis not present

## 2021-05-09 ENCOUNTER — Other Ambulatory Visit: Payer: Self-pay

## 2021-05-09 ENCOUNTER — Encounter: Payer: Self-pay | Admitting: Interventional Cardiology

## 2021-05-09 ENCOUNTER — Ambulatory Visit: Payer: Medicare PPO | Admitting: Interventional Cardiology

## 2021-05-09 VITALS — BP 170/90 | HR 54 | Ht 71.0 in | Wt 232.4 lb

## 2021-05-09 DIAGNOSIS — Z8546 Personal history of malignant neoplasm of prostate: Secondary | ICD-10-CM | POA: Diagnosis not present

## 2021-05-09 DIAGNOSIS — G4733 Obstructive sleep apnea (adult) (pediatric): Secondary | ICD-10-CM | POA: Diagnosis not present

## 2021-05-09 DIAGNOSIS — C61 Malignant neoplasm of prostate: Secondary | ICD-10-CM

## 2021-05-09 DIAGNOSIS — I10 Essential (primary) hypertension: Secondary | ICD-10-CM

## 2021-05-09 LAB — BASIC METABOLIC PANEL
BUN/Creatinine Ratio: 11 (ref 10–24)
BUN: 11 mg/dL (ref 8–27)
CO2: 25 mmol/L (ref 20–29)
Calcium: 8.9 mg/dL (ref 8.6–10.2)
Chloride: 103 mmol/L (ref 96–106)
Creatinine, Ser: 0.97 mg/dL (ref 0.76–1.27)
Glucose: 93 mg/dL (ref 70–99)
Potassium: 4.1 mmol/L (ref 3.5–5.2)
Sodium: 141 mmol/L (ref 134–144)
eGFR: 82 mL/min/{1.73_m2} (ref >59)

## 2021-05-09 MED ORDER — SPIRONOLACTONE 25 MG PO TABS: 25.0000 mg | ORAL_TABLET | Freq: Every day | ORAL | 3 refills | Status: DC

## 2021-05-09 MED ORDER — NEBIVOLOL HCL 2.5 MG PO TABS: 2.5000 mg | ORAL_TABLET | Freq: Every day | ORAL | 3 refills | Status: DC

## 2021-05-09 NOTE — Patient Instructions (Addendum)
Medication Instructions:  1) START Spironolactone 25mg  once daily 2) START Bystolic 2.5mg  once daily at night  *If you need a refill on your cardiac medications before your next appointment, please call your pharmacy*   Lab Work: BMET today  If you have labs (blood work) drawn today and your tests are completely normal, you will receive your results only by: [place] (if you have MyChart) OR A paper copy in the mail If you have any lab test that is abnormal or we need to change your treatment, we will call you to review the results.   Testing/Procedures: Your physician has requested that you have an echocardiogram. Echocardiography is a painless test that uses sound waves to create images of your heart. It provides your doctor with information about the size and shape of your heart and how well your heart's chambers and valves are working. This procedure takes approximately one hour. There are no restrictions for this procedure.    Follow-Up: At [facility], you and your health needs are our priority.  As part of our continuing mission to provide you with exceptional heart care, we have created designated Provider Care Teams.  These Care Teams include your primary Cardiologist (physician) and Advanced Practice Providers (APPs -  Physician Assistants and Nurse Practitioners) who all work together to provide you with the care you need, when you need it.  We recommend signing up for the patient portal called "MyChart".  Sign up information is provided on this After Visit Summary.  MyChart is used to connect with patients for Virtual Visits (Telemedicine).  Patients are able to view lab/test results, encounter notes, upcoming appointments, etc.  Non-urgent messages can be sent to your provider as well.   To learn more about what you can do with MyChart, go to [email or web address].    Your next appointment:   2 month(s)- can have 12/9 at 12pm  The format for your next  appointment:   In Person  Provider:   You may see Sinclair Grooms, MD or one of the following Advanced Practice Providers on your designated Care Team:   Cecilie Kicks, NP   Other Instructions

## 2021-05-09 NOTE — Progress Notes (Signed)
Cardiology Office Note:    Date:  05/09/2021   ID:  Andrew Rogers, DOB [birth year], MRN [id number]  PCP:  Shon Baton, MD  Cardiologist:  Sinclair Grooms, MD   Referring MD: Shon Baton, MD   Chief Complaint  Patient presents with   Hypertension    History of Present Illness:    Andrew Rogers is a 75 y.o. male with a hx of prostate cancer status post robotic resection 2015.  Here for cardiac evaluation because of persistent hypertension.  Cardiovascular risk factors include obstructive sleep apnea, obesity, and primary hypertension.  Andrew Rogers is here for counseling concerning blood pressure control.  He denies chest discomfort, dyspnea, edema, palpitations, syncope, orthopnea, and PND.  He has been doing a lot of walking recently.  He has lost weight.  He denies claudication.  He has no limitations with physical activity.  He is getting greater than 150 minutes of moderate activity per week for greater than the last 3 months.  Past Medical History:  Diagnosis Date   Allergic reaction to grass pollen    Anxiety    JUST OVER IMPENDING PROSTATE SURGERY   Bradycardia    Dermatitis    Deviated septum    Disturbance of skin sensation    Dyspepsia    ED (erectile dysfunction)    Hearing loss    Hyperhidrosis    Hypertension    NEW DIAGNOSIS OF HYPERTENSION - STARTED MEDICATION FOR B/P 02/20/14   Hypertensive chronic kidney disease    Internal hemorrhoids    Malignant neoplasm of prostate ([place])    Obesity    Osteoarthritis    Polyp of colon    Prostate cancer ([place]) 06/05/2008   gleason 6   Proteinuria    Sleep apnea    USES CPAP SETTING 7   Urinary incontinence    Urticaria     Past Surgical History:  Procedure Laterality Date   COLONOSCOPY     LYMPHADENECTOMY Bilateral 04/12/2014   Procedure: BILATERAL LYMPHADENECTOMY;  Surgeon: Raynelle Bring, MD;  Location: [facility];  Service: Urology;  Laterality: Bilateral;   PROSTATE BIOPSY  x 5   Gleason 4+3=7, PSA 5.25 on  12/20/13, volume 65.1 cc   ROBOT ASSISTED LAPAROSCOPIC RADICAL PROSTATECTOMY N/A 04/12/2014   Procedure: ROBOTIC ASSISTED LAPAROSCOPIC RADICAL PROSTATECTOMY (LEVEL 2);  Surgeon: Raynelle Bring, MD;  Location: [facility];  Service: Urology;  Laterality: N/A;   salivary gland surgey     stone removed as teen   TONSILLECTOMY     child    Current Medications: Current Meds  Medication Sig   ciprofloxacin (CIPRO) 500 MG tablet Take 1 tablet (500 mg total) by mouth 2 (two) times daily. Start day prior to office visit for foley removal   clobetasol cream (TEMOVATE) 8.92 % Apply 1 application topically 2 (two) times daily as needed (for rash).   Fluticasone Furoate 50 MCG/ACT AEPB Inhale into the lungs.   fluticasone furoate-vilanterol (BREO ELLIPTA) 200-25 MCG/INH AEPB Inhale 1 puff into the lungs daily.   hydrALAZINE (APRESOLINE) 25 MG tablet Take 25 mg by mouth 2 (two) times daily.   HYDROcodone-acetaminophen (NORCO) 5-325 MG per tablet Take 1-2 tablets by mouth every 6 (six) hours as needed.   hydrocortisone 2.5 % cream daily.   loratadine (CLARITIN) 10 MG tablet Take 10 mg by mouth daily.   nebivolol (BYSTOLIC) 2.5 MG tablet Take 1 tablet (2.5 mg total) by mouth at bedtime.   omeprazole (PRILOSEC) 20 MG capsule Take 20  mg by mouth daily.   Potassium 99 MG TABS Take by mouth.   spironolactone (ALDACTONE) 25 MG tablet Take 1 tablet (25 mg total) by mouth daily.   [DISCONTINUED] nebivolol (BYSTOLIC) 5 MG tablet Take 2.5 mg by mouth. TAKE 1/2 OF 5 MG TABLET AT BEDTIME IF B/P GREATER THAN 140/90 AND IF PULSE NOT LESS THAN 50.    THIS NEW B/P MEDICATION JUST STARTING TODAY 04/09/14.   [DISCONTINUED] spironolactone (ALDACTONE) 25 MG tablet Take 25 mg by mouth daily.     Allergies:   Clonidine derivatives, Doxycycline, Losartan, Norvasc [amlodipine], Septra [sulfamethoxazole-trimethoprim], and Lisinopril   Social History   Socioeconomic History   Marital status: Married    Spouse name: Not on file    Number of children: Not on file   Years of education: Not on file   Highest education level: Not on file  Occupational History   Not on file  Tobacco Use   Smoking status: Never   Smokeless tobacco: Never  Substance and Sexual Activity   Alcohol use: No   Drug use: No   Sexual activity: Not on file  Other Topics Concern   Not on file  Social History Narrative   Not on file   Social Determinants of Health   Financial Resource Strain: Not on file  Food Insecurity: Not on file  Transportation Needs: Not on file  Physical Activity: Not on file  Stress: Not on file  Social Connections: Not on file     Family History: The patient's family history includes Cancer in an other family member; Hypertension in his mother; Seizures in his brother.  ROS:   Please see the history of present illness.    Multiple instances of antihypertensives intolerance: Losartan (rash and cough); lisinopril (flare of allergies); amlodipine (rash and itching); Septra (flulike symptoms); clonidine (itching and dryness); hydralazine (joint stiffness). The patient complains of joint stiffness on his current set of medications.  He provides a copy of hand x-rays both of which demonstrate osteoarthritis in the STT joint  All other systems reviewed and are negative.  EKGs/Labs/Other Studies Reviewed:    The following studies were reviewed today: No cardiac imaging data  EKG:  EKG performed April 14, 2014 demonstrates normal sinus rhythm with left anterior hemiblock.  The current tracing sinus bradycardia 54 bpm, PR interval 206 ms, left anterior hemiblock/left axis deviation.  No change when compared to the EKG performed by Dr. Virgina Jock in 2018.  Recent Labs: No results found for requested labs within last 8760 hours.  Recent Lipid Panel No results found for: CHOL, TRIG, HDL, CHOLHDL, VLDL, LDLCALC, LDLDIRECT  Physical Exam:    VS:  BP (!) 170/90   Pulse (!) 54   Ht 5\' 11"  (1.803 m)   Wt 232 lb 6.4  oz (105.4 kg)   SpO2 99%   BMI 32.41 kg/m     Wt Readings from Last 3 Encounters:  05/09/21 232 lb 6.4 oz (105.4 kg)  04/12/14 216 lb (98 kg)  04/09/14 216 lb (98 kg)     GEN: Overweight. No acute distress HEENT: Normal NECK: No JVD. LYMPHATICS: No lymphadenopathy CARDIAC: No murmur. RRR no gallop, or edema. VASCULAR:  Normal Pulses. No bruits. RESPIRATORY:  Clear to auscultation without rales, wheezing or rhonchi  ABDOMEN: Soft, non-tender, non-distended, No pulsatile mass, MUSCULOSKELETAL: No deformity  SKIN: Warm and dry NEUROLOGIC:  Alert and oriented x 3 PSYCHIATRIC:  Normal affect   ASSESSMENT:    1. Primary hypertension  2. Prostate cancer ([place])   3. Morbid obesity ([place])   4. OSA (obstructive sleep apnea)    PLAN:    In order of problems listed above:  We discussed nonpharmacologic management of hypertension.  Sleep apnea is present but being managed.  With that though he still gets less than 6 hours of sleep.  He could do better with sodium management.  He is not on alcohol or non-steroidal anti-inflammatory therapy.  Multiple instances of medication intolerance rendering management almost impossible.  Continue spironolactone 25 mg/day and may need to increase to 50 mg/day.  A basic metabolic panel is going to be done today to assess kidney function on 25 mg/day as well as potassium.  Continue doxazosin.  Resume nebivolol 2.5 mg/day.  2 to 6 weeks follow-up.  2D Doppler echocardiogram to check for endorgan damage.  Target BP: <130/80 mmHg  Diet and lifestyle measures for BP control were reviewed in detail: Low sodium diet (<2.5 gm daily); alcohol restriction (<3 ounces per day); weight loss (Mediterranean); avoid non-steroidal agents; > 6 hours sleep per day; 150 min moderate exercise per week. Medical regimen will include at least 2 agents. Resistant hypertension if not controlled on 3 agents. Consider further evaluation: Sleep study to r/o OSA; Renal angiogram;  Primary hyperaldonism and Pheochromocytoma w/u. After 3 agents, consider MRA (spironolactone)/ Epleronone), hydralazine, beta-blocker, and Minoxidil if not already in use due to patient profile.  Prolonged office visit related to teaching.  Medication Adjustments/Labs and Tests Ordered: Current medicines are reviewed at length with the patient today.  Concerns regarding medicines are outlined above.  Orders Placed This Encounter  Procedures   Basic metabolic panel   EKG [id number]   ECHOCARDIOGRAM COMPLETE   Meds ordered this encounter  Medications   nebivolol (BYSTOLIC) 2.5 MG tablet    Sig: Take 1 tablet (2.5 mg total) by mouth at bedtime.    Dispense:  90 tablet    Refill:  3   spironolactone (ALDACTONE) 25 MG tablet    Sig: Take 1 tablet (25 mg total) by mouth daily.    Dispense:  90 tablet    Refill:  3    Patient Instructions  Medication Instructions:  1) START Spironolactone 25mg  once daily 2) START Bystolic 2.5mg  once daily at night  *If you need a refill on your cardiac medications before your next appointment, please call your pharmacy*   Lab Work: BMET today  If you have labs (blood work) drawn today and your tests are completely normal, you will receive your results only by: [place] (if you have MyChart) OR A paper copy in the mail If you have any lab test that is abnormal or we need to change your treatment, we will call you to review the results.   Testing/Procedures: Your physician has requested that you have an echocardiogram. Echocardiography is a painless test that uses sound waves to create images of your heart. It provides your doctor with information about the size and shape of your heart and how well your heart's chambers and valves are working. This procedure takes approximately one hour. There are no restrictions for this procedure.    Follow-Up: At [facility], you and your health needs are our priority.  As part of our continuing  mission to provide you with exceptional heart care, we have created designated Provider Care Teams.  These Care Teams include your primary Cardiologist (physician) and Advanced Practice Providers (APPs -  Physician Assistants and Nurse Practitioners) who all work together  to provide you with the care you need, when you need it.  We recommend signing up for the patient portal called "MyChart".  Sign up information is provided on this After Visit Summary.  MyChart is used to connect with patients for Virtual Visits (Telemedicine).  Patients are able to view lab/test results, encounter notes, upcoming appointments, etc.  Non-urgent messages can be sent to your provider as well.   To learn more about what you can do with MyChart, go to [email or web address].    Your next appointment:   2 month(s)- can have 12/9 at 12pm  The format for your next appointment:   In Person  Provider:   You may see Sinclair Grooms, MD or one of the following Advanced Practice Providers on your designated Care Team:   Cecilie Kicks, NP   Other Instructions     Signed, Sinclair Grooms, MD  05/09/2021 9:52 AM    [place]

## 2021-05-16 DIAGNOSIS — N5201 Erectile dysfunction due to arterial insufficiency: Secondary | ICD-10-CM | POA: Diagnosis not present

## 2021-05-16 DIAGNOSIS — Z8546 Personal history of malignant neoplasm of prostate: Secondary | ICD-10-CM | POA: Diagnosis not present

## 2021-05-19 ENCOUNTER — Other Ambulatory Visit: Payer: Self-pay

## 2021-05-19 ENCOUNTER — Ambulatory Visit (HOSPITAL_COMMUNITY): Payer: Medicare PPO | Attending: Cardiovascular Disease

## 2021-05-19 DIAGNOSIS — I10 Essential (primary) hypertension: Secondary | ICD-10-CM | POA: Diagnosis not present

## 2021-05-19 LAB — ECHOCARDIOGRAM COMPLETE
Area-P 1/2: 3.65 cm2
S' Lateral: 2.9 cm

## 2021-06-05 ENCOUNTER — Ambulatory Visit: Payer: Medicare PPO | Admitting: Interventional Cardiology

## 2021-06-06 ENCOUNTER — Telehealth: Payer: Self-pay | Admitting: Interventional Cardiology

## 2021-06-06 NOTE — Telephone Encounter (Signed)
Spoke with pt and B/P yesterday was 108/70 and today was 108/73 and these values were  prior to taking the Spironolactone  Pt did take med a little while ago HR yesterday was 46 and 53 today Pt is also taking Doxazosin 2 mg and Bystolic 2.5 mg Pt did note some dizziness no other symptoms Pt was wanting to know if should stop Spirolactone since readings are low Will forward to Dr Tamala Julian for review and recommendations ./cy

## 2021-06-06 NOTE — Telephone Encounter (Signed)
Pt c/o medication issue:  1. Name of Medication: spironolactone (ALDACTONE) 25 MG tablet  2. How are you currently taking this medication (dosage and times per day)? Take 1 tablet (25 mg total) by mouth daily.  3. Are you having a reaction (difficulty breathing--STAT)? no  4. What is your medication issue? Pt forgot to take this medicine yesterday and his bp is still dropping.. pt is wanting to know if the dosage of this medication should be changed and also if he can come off of the doxazosin (didn't see this listed in his chart).... pt states that it makes him go to the restroom

## 2021-06-06 NOTE — Telephone Encounter (Signed)
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

## 2021-07-03 NOTE — Progress Notes (Signed)
Cardiology Office Note:    Date:  07/04/2021   ID:  Andrew Rogers, DOB 05-13-1946, MRN [id number]  PCP:  Shon Baton, MD  Cardiologist:  Sinclair Grooms, MD   Referring MD: Shon Baton, MD   Chief Complaint  Patient presents with   Hypertension    History of Present Illness:    ABRHAM Rogers is a 75 y.o. male with a hx of prostate cancer status post robotic resection 2015.  Here for cardiac evaluation because of persistent hypertension.  Cardiovascular risk factors include obstructive sleep apnea, obesity, and primary hypertension.   Mr. Vecchio is doing well on his current medical regimen.  He is following detailed blood pressure record and generally has had most blood pressures less than 140/90 and on average in the 130/78 mmHg range.  Heart rates tend to run between 45 and 60 bpm.  His current medical regimen is nebivolol 2.5 mg daily and spironolactone 25 mg/day.  His last potassium was 4.1 in October 2022.  He will have an upcoming physical exam with his primary physician Dr. Virgina Jock in January/February timeframe.  Be met should be done at that time.  Past Medical History:  Diagnosis Date   Allergic reaction to grass pollen    Anxiety    JUST OVER IMPENDING PROSTATE SURGERY   Bradycardia    Dermatitis    Deviated septum    Disturbance of skin sensation    Dyspepsia    ED (erectile dysfunction)    Hearing loss    Hyperhidrosis    Hypertension    NEW DIAGNOSIS OF HYPERTENSION - STARTED MEDICATION FOR B/P 02/20/14   Hypertensive chronic kidney disease    Internal hemorrhoids    Malignant neoplasm of prostate ([place])    Obesity    Osteoarthritis    Polyp of colon    Prostate cancer ([place]) 06/05/2008   gleason 6   Proteinuria    Sleep apnea    USES CPAP SETTING 7   Urinary incontinence    Urticaria     Past Surgical History:  Procedure Laterality Date   COLONOSCOPY     LYMPHADENECTOMY Bilateral 04/12/2014   Procedure: BILATERAL LYMPHADENECTOMY;  Surgeon: Raynelle Bring, MD;  Location: [facility];  Service: Urology;  Laterality: Bilateral;   PROSTATE BIOPSY  x 5   Gleason 4+3=7, PSA 5.25 on 12/20/13, volume 65.1 cc   ROBOT ASSISTED LAPAROSCOPIC RADICAL PROSTATECTOMY N/A 04/12/2014   Procedure: ROBOTIC ASSISTED LAPAROSCOPIC RADICAL PROSTATECTOMY (LEVEL 2);  Surgeon: Raynelle Bring, MD;  Location: [facility];  Service: Urology;  Laterality: N/A;   salivary gland surgey     stone removed as teen   TONSILLECTOMY     child    Current Medications: Current Meds  Medication Sig   clobetasol cream (TEMOVATE) 8.67 % Apply 1 application topically 2 (two) times daily as needed (for rash).   Fluticasone Furoate 50 MCG/ACT AEPB Inhale into the lungs.   fluticasone furoate-vilanterol (BREO ELLIPTA) 200-25 MCG/INH AEPB Inhale 1 puff into the lungs daily.   hydrocortisone 2.5 % cream daily.   loratadine (CLARITIN) 10 MG tablet Take 10 mg by mouth daily.   nebivolol (BYSTOLIC) 2.5 MG tablet Take 2.5 mg by mouth at bedtime.   spironolactone (ALDACTONE) 25 MG tablet Take 1 tablet (25 mg total) by mouth daily.   [DISCONTINUED] Potassium 99 MG TABS Take by mouth.     Allergies:   Clonidine derivatives, Doxycycline, Losartan, Norvasc [amlodipine], Septra [sulfamethoxazole-trimethoprim], and Lisinopril   Social History  Socioeconomic History   Marital status: Married    Spouse name: Not on file   Number of children: Not on file   Years of education: Not on file   Highest education level: Not on file  Occupational History   Not on file  Tobacco Use   Smoking status: Never   Smokeless tobacco: Never  Substance and Sexual Activity   Alcohol use: No   Drug use: No   Sexual activity: Not on file  Other Topics Concern   Not on file  Social History Narrative   Not on file   Social Determinants of Health   Financial Resource Strain: Not on file  Food Insecurity: Not on file  Transportation Needs: Not on file  Physical Activity: Not on file  Stress: Not on file   Social Connections: Not on file     Family History: The patient's family history includes Cancer in an other family member; Hypertension in his mother; Seizures in his brother.  ROS:   Please see the history of present illness.    Occasional cramping.  In the past he would take potassium.  I warned him about potassium intake as this could be dangerous with him on spironolactone.  All other systems reviewed and are negative.  EKGs/Labs/Other Studies Reviewed:    The following studies were reviewed today:  2 D Doppler ECHOCARDIOGRAM 05/19/2021: IMPRESSIONS     1. Left ventricular ejection fraction, by estimation, is 60 to 65%. The  left ventricle has normal function. The left ventricle has no regional  wall motion abnormalities. Left ventricular diastolic parameters were  normal.   2. Right ventricular systolic function is normal. The right ventricular  size is normal. Tricuspid regurgitation signal is inadequate for assessing  PA pressure.   3. The mitral valve is grossly normal. Trivial mitral valve  regurgitation. No evidence of mitral stenosis.   4. The aortic valve is tricuspid. Aortic valve regurgitation is not  visualized. No aortic stenosis is present.   5. The inferior vena cava is normal in size with greater than 50%  respiratory variability, suggesting right atrial pressure of 3 mmHg.   Conclusion(s)/Recommendation(s): Normal biventricular function without  evidence of hemodynamically significant valvular heart disease.   EKG:  EKG was not repeated  Recent Labs: 05/09/2021: BUN 11; Creatinine, Ser 0.97; Potassium 4.1; Sodium 141  Recent Lipid Panel No results found for: CHOL, TRIG, HDL, CHOLHDL, VLDL, LDLCALC, LDLDIRECT  Physical Exam:    VS:  BP 138/90   Pulse 67   Ht 5' 11"  (1.803 m)   Wt 226 lb 9.6 oz (102.8 kg)   SpO2 97%   BMI 31.60 kg/m     Wt Readings from Last 3 Encounters:  07/04/21 226 lb 9.6 oz (102.8 kg)  05/09/21 232 lb 6.4 oz (105.4 kg)   04/12/14 216 lb (98 kg)     GEN: He is down 6 pounds from October.  He looks healthy.. No acute distress HEENT: Normal NECK: No JVD. LYMPHATICS: No lymphadenopathy CARDIAC: No murmur. RRR no gallop, or edema. VASCULAR:  Normal Pulses. No bruits. RESPIRATORY:  Clear to auscultation without rales, wheezing or rhonchi  ABDOMEN: Soft, non-tender, non-distended, No pulsatile mass, MUSCULOSKELETAL: No deformity  SKIN: Warm and dry NEUROLOGIC:  Alert and oriented x 3 PSYCHIATRIC:  Normal affect   ASSESSMENT:    1. Primary hypertension   2. OSA (obstructive sleep apnea)   3. Morbid obesity ([place])    PLAN:    In order of problems  listed above:  Echo demonstrates no structural abnormalities associated with hypertension.  His blood pressure is under very good control with few if any blood pressures above 140/90 and on average around 130/75-80 mmHg. He is compliant with CPAP. He has lost significant weight over the last 6 months and this is helped control the blood pressure and has allowed fewer medications to be used for control.   He needs to monitor potassium and creatinine at least twice a year.  Next will be with primary physician in early 2023.  We will plan to see him back in 1 year at his request.   Medication Adjustments/Labs and Tests Ordered: Current medicines are reviewed at length with the patient today.  Concerns regarding medicines are outlined above.  No orders of the defined types were placed in this encounter.  No orders of the defined types were placed in this encounter.   Patient Instructions  Medication Instructions:  Your physician recommends that you continue on your current medications as directed. Please refer to the Current Medication list given to you today.  *If you need a refill on your cardiac medications before your next appointment, please call your pharmacy*   Lab Work: None If you have labs (blood work) drawn today and your tests are  completely normal, you will receive your results only by: [place] (if you have MyChart) OR A paper copy in the mail If you have any lab test that is abnormal or we need to change your treatment, we will call you to review the results.   Testing/Procedures: None   Follow-Up: At [facility], you and your health needs are our priority.  As part of our continuing mission to provide you with exceptional heart care, we have created designated Provider Care Teams.  These Care Teams include your primary Cardiologist (physician) and Advanced Practice Providers (APPs -  Physician Assistants and Nurse Practitioners) who all work together to provide you with the care you need, when you need it.  We recommend signing up for the patient portal called "MyChart".  Sign up information is provided on this After Visit Summary.  MyChart is used to connect with patients for Virtual Visits (Telemedicine).  Patients are able to view lab/test results, encounter notes, upcoming appointments, etc.  Non-urgent messages can be sent to your provider as well.   To learn more about what you can do with MyChart, go to [email or web address].    Your next appointment:   1 year(s)  The format for your next appointment:   In Person  Provider:   Sinclair Grooms, MD     Other Instructions     Signed, Sinclair Grooms, MD  07/04/2021 10:06 AM    [place]

## 2021-07-04 ENCOUNTER — Ambulatory Visit: Payer: Medicare PPO | Admitting: Interventional Cardiology

## 2021-07-04 ENCOUNTER — Other Ambulatory Visit: Payer: Self-pay

## 2021-07-04 ENCOUNTER — Encounter: Payer: Self-pay | Admitting: Interventional Cardiology

## 2021-07-04 VITALS — BP 138/90 | HR 67 | Ht 71.0 in | Wt 226.6 lb

## 2021-07-04 DIAGNOSIS — I10 Essential (primary) hypertension: Secondary | ICD-10-CM

## 2021-07-04 DIAGNOSIS — G4733 Obstructive sleep apnea (adult) (pediatric): Secondary | ICD-10-CM

## 2021-07-04 NOTE — Patient Instructions (Signed)

## 2021-09-03 DIAGNOSIS — I10 Essential (primary) hypertension: Secondary | ICD-10-CM | POA: Diagnosis not present

## 2021-09-03 DIAGNOSIS — Z1211 Encounter for screening for malignant neoplasm of colon: Secondary | ICD-10-CM | POA: Diagnosis not present

## 2021-09-03 DIAGNOSIS — R0600 Dyspnea, unspecified: Secondary | ICD-10-CM | POA: Diagnosis not present

## 2021-09-18 DIAGNOSIS — I129 Hypertensive chronic kidney disease with stage 1 through stage 4 chronic kidney disease, or unspecified chronic kidney disease: Secondary | ICD-10-CM | POA: Diagnosis not present

## 2021-09-18 DIAGNOSIS — C61 Malignant neoplasm of prostate: Secondary | ICD-10-CM | POA: Diagnosis not present

## 2021-09-18 DIAGNOSIS — N181 Chronic kidney disease, stage 1: Secondary | ICD-10-CM | POA: Diagnosis not present

## 2021-09-18 DIAGNOSIS — R001 Bradycardia, unspecified: Secondary | ICD-10-CM | POA: Diagnosis not present

## 2021-09-30 DIAGNOSIS — K635 Polyp of colon: Secondary | ICD-10-CM | POA: Diagnosis not present

## 2021-09-30 DIAGNOSIS — D120 Benign neoplasm of cecum: Secondary | ICD-10-CM | POA: Diagnosis not present

## 2021-09-30 DIAGNOSIS — Z1211 Encounter for screening for malignant neoplasm of colon: Secondary | ICD-10-CM | POA: Diagnosis not present

## 2021-10-02 DIAGNOSIS — L562 Photocontact dermatitis [berloque dermatitis]: Secondary | ICD-10-CM | POA: Diagnosis not present

## 2021-10-02 DIAGNOSIS — Z0000 Encounter for general adult medical examination without abnormal findings: Secondary | ICD-10-CM | POA: Diagnosis not present

## 2021-10-02 DIAGNOSIS — N181 Chronic kidney disease, stage 1: Secondary | ICD-10-CM | POA: Diagnosis not present

## 2021-10-02 DIAGNOSIS — E669 Obesity, unspecified: Secondary | ICD-10-CM | POA: Diagnosis not present

## 2021-10-02 DIAGNOSIS — I129 Hypertensive chronic kidney disease with stage 1 through stage 4 chronic kidney disease, or unspecified chronic kidney disease: Secondary | ICD-10-CM | POA: Diagnosis not present

## 2021-10-02 DIAGNOSIS — R1013 Epigastric pain: Secondary | ICD-10-CM | POA: Diagnosis not present

## 2021-10-02 DIAGNOSIS — R809 Proteinuria, unspecified: Secondary | ICD-10-CM | POA: Diagnosis not present

## 2021-10-02 DIAGNOSIS — G4733 Obstructive sleep apnea (adult) (pediatric): Secondary | ICD-10-CM | POA: Diagnosis not present

## 2021-10-02 DIAGNOSIS — R209 Unspecified disturbances of skin sensation: Secondary | ICD-10-CM | POA: Diagnosis not present

## 2021-11-03 DIAGNOSIS — L564 Polymorphous light eruption: Secondary | ICD-10-CM | POA: Diagnosis not present

## 2021-12-05 DIAGNOSIS — M5136 Other intervertebral disc degeneration, lumbar region: Secondary | ICD-10-CM | POA: Diagnosis not present

## 2022-05-20 DIAGNOSIS — Z8546 Personal history of malignant neoplasm of prostate: Secondary | ICD-10-CM | POA: Diagnosis not present

## 2022-05-20 DIAGNOSIS — N5201 Erectile dysfunction due to arterial insufficiency: Secondary | ICD-10-CM | POA: Diagnosis not present

## 2022-05-25 ENCOUNTER — Other Ambulatory Visit: Payer: Self-pay | Admitting: Interventional Cardiology

## 2022-06-05 ENCOUNTER — Other Ambulatory Visit: Payer: Self-pay | Admitting: Interventional Cardiology

## 2022-06-18 ENCOUNTER — Other Ambulatory Visit: Payer: Self-pay | Admitting: Interventional Cardiology

## 2022-07-14 ENCOUNTER — Ambulatory Visit (INDEPENDENT_AMBULATORY_CARE_PROVIDER_SITE_OTHER): Payer: No Typology Code available for payment source | Admitting: Pulmonary Disease

## 2022-07-14 ENCOUNTER — Ambulatory Visit (INDEPENDENT_AMBULATORY_CARE_PROVIDER_SITE_OTHER): Payer: No Typology Code available for payment source

## 2022-07-14 ENCOUNTER — Encounter: Payer: Self-pay | Admitting: Pulmonary Disease

## 2022-07-14 VITALS — Ht 71.0 in | Wt 238.0 lb

## 2022-07-14 DIAGNOSIS — J4530 Mild persistent asthma, uncomplicated: Secondary | ICD-10-CM | POA: Diagnosis not present

## 2022-07-14 DIAGNOSIS — R0600 Dyspnea, unspecified: Secondary | ICD-10-CM | POA: Diagnosis not present

## 2022-07-14 DIAGNOSIS — R09A2 Foreign body sensation, throat: Secondary | ICD-10-CM

## 2022-07-14 DIAGNOSIS — K219 Gastro-esophageal reflux disease without esophagitis: Secondary | ICD-10-CM | POA: Diagnosis not present

## 2022-07-14 DIAGNOSIS — R0602 Shortness of breath: Secondary | ICD-10-CM | POA: Diagnosis not present

## 2022-07-14 DIAGNOSIS — J45909 Unspecified asthma, uncomplicated: Secondary | ICD-10-CM | POA: Diagnosis not present

## 2022-07-14 MED ORDER — NYSTATIN 100000 UNIT/ML MT SUSP: 5.0000 mL | Freq: Four times a day (QID) | OROMUCOSAL | 1 refills | Status: DC

## 2022-07-14 MED ORDER — FLUTICASONE-SALMETEROL 115-21 MCG/ACT IN AERO: 2.0000 | INHALATION_SPRAY | Freq: Two times a day (BID) | RESPIRATORY_TRACT | 12 refills | Status: DC

## 2022-07-14 NOTE — Addendum Note (Signed)
Addended by: Valerie Salts on: 07/14/2022 03:43 PM   Modules accepted: Orders

## 2022-07-14 NOTE — Progress Notes (Signed)
Synopsis: Referred in December 2023 for asthma  Subjective:   PATIENT ID: Andrew Rogers GENDER: male DOB: 15-Sep-1945, MRN: [id number]   HPI  Chief Complaint  Patient presents with   Consult    Referred by [facility] for possible asthma. States he was on [place] and it did not work. He had an allergic reaction to [facility]. Believes the Bystolic stated his increased SOB and wheezing.     OSA > uses CPAP a night > felt that the moisture from his machine made congestion in his chest worse  He has a fair amount of congestion in his throat > started on Breo, didn't completely knock it out > they tried [employer] and he had an adverse reaction and he was switched to Breo > he was started on inhaled medicines for this problem 2 years ago > he worried that a blood pressure medicine made his throat congestion worse > he takes claritin to help  > he worried that the nebivolol was making it worse so he stopped taking it > he says that stopping the nebivolol helped a little > he'll notice the sensation during the daytime too, not just at night > He says that dialing back moisture in his CPAP machine didn't help  > he has coughed up "spongy" thick brown mucus.   He has had some dyspnea: > he says that he went to the beach 6 months ago and when he tried to walk he was getting short of breath which is unusual for him > he says that he has been sweating a bunch compared to prior, so this has made him stop walking for as much as he used  Childhood normal, no respiratory problems Never smoked  He was around [place] in [place]  Always worked in an office environment  No changes in his home environment  He purchased two air filters, this didn't help  He doesn't have heartburn  Several weks ago he had a lot of vomiting that helped  Record review: Cardiology note from December 2023 reviewed with the patient was noted to be compliant with CPAP, had lost some weight and blood pressure was  well-controlled.  Past Medical History:  Diagnosis Date   Allergic reaction to grass pollen    Anxiety    JUST OVER IMPENDING PROSTATE SURGERY   Bradycardia    Dermatitis    Deviated septum    Disturbance of skin sensation    Dyspepsia    ED (erectile dysfunction)    Hearing loss    Hyperhidrosis    Hypertension    NEW DIAGNOSIS OF HYPERTENSION - STARTED MEDICATION FOR B/P 02/20/14   Hypertensive chronic kidney disease    Internal hemorrhoids    Malignant neoplasm of prostate (HCC)    Obesity    Osteoarthritis    Polyp of colon    Prostate cancer ([place]) 06/05/2008   gleason 6   Proteinuria    Sleep apnea    USES CPAP SETTING 7   Urinary incontinence    Urticaria      Family History  Problem Relation Age of Onset   Hypertension Mother    Seizures Brother    Cancer Other        cervical     Social History   Socioeconomic History   Marital status: Married    Spouse name: Not on file   Number of children: Not on file   Years of education: Not on file   Highest education level: Not on  file  Occupational History   Not on file  Tobacco Use   Smoking status: Never    Passive exposure: Never   Smokeless tobacco: Never  Substance and Sexual Activity   Alcohol use: No   Drug use: No   Sexual activity: Not on file  Other Topics Concern   Not on file  Social History Narrative   Not on file   Social Determinants of Health   Financial Resource Strain: Not on file  Food Insecurity: Not on file  Transportation Needs: Not on file  Physical Activity: Not on file  Stress: Not on file  Social Connections: Not on file  Intimate Partner Violence: Not on file     Allergies  Allergen Reactions   Bystolic [Nebivolol Hcl]    Clonidine Derivatives    Doxycycline    Losartan     Allergy symptoms   Norvasc [Amlodipine]    Septra [Sulfamethoxazole-Trimethoprim]     Flu like symptoms   Hydralazine Rash   Lisinopril Rash     Outpatient Medications Prior to Visit   Medication Sig Dispense Refill   hydrocortisone 2.5 % cream daily.     lidocaine (LIDODERM) 5 % 1 patch.     loratadine (CLARITIN) 10 MG tablet Take 10 mg by mouth daily.     nebivolol (BYSTOLIC) 2.5 MG tablet TAKE 1 TABLET BY MOUTH AT BEDTIME . APPOINTMENT REQUIRED FOR FUTURE REFILLS 90 tablet 0   spironolactone (ALDACTONE) 25 MG tablet Take 1 tablet (25 mg total) by mouth daily. 90 tablet 0   hydrALAZINE (APRESOLINE) 25 MG tablet Take 25 mg by mouth 2 (two) times daily.     ciprofloxacin (CIPRO) 500 MG tablet Take 1 tablet (500 mg total) by mouth 2 (two) times daily. Start day prior to office visit for foley removal (Patient not taking: Reported on 07/04/2021) 6 tablet 0   clobetasol cream (TEMOVATE) 6.38 % Apply 1 application topically 2 (two) times daily as needed (for rash).     Fluticasone Furoate 50 MCG/ACT AEPB Inhale into the lungs.     fluticasone furoate-vilanterol (BREO ELLIPTA) 200-25 MCG/INH AEPB Inhale 1 puff into the lungs daily.     HYDROcodone-acetaminophen (NORCO) 5-325 MG per tablet Take 1-2 tablets by mouth every 6 (six) hours as needed. (Patient not taking: Reported on 07/04/2021) 30 tablet 0   omeprazole (PRILOSEC) 20 MG capsule Take 20 mg by mouth daily. (Patient not taking: Reported on 07/04/2021)     No facility-administered medications prior to visit.    Review of Systems  Constitutional:  Negative for chills, fever, malaise/fatigue and weight loss.  HENT:  Negative for congestion, nosebleeds, sinus pain and sore throat.   Eyes:  Negative for photophobia, pain and discharge.  Respiratory:  Positive for cough and shortness of breath. Negative for hemoptysis, sputum production and wheezing.   Cardiovascular:  Negative for chest pain, palpitations, orthopnea and leg swelling.  Gastrointestinal:  Negative for abdominal pain, constipation, diarrhea, nausea and vomiting.  Genitourinary:  Negative for dysuria, frequency, hematuria and urgency.  Musculoskeletal:  Negative  for back pain, joint pain, myalgias and neck pain.  Skin:  Negative for itching and rash.  Neurological:  Negative for tingling, tremors, sensory change, speech change, focal weakness, seizures, weakness and headaches.  Psychiatric/Behavioral:  Negative for memory loss, substance abuse and suicidal ideas. The patient is not nervous/anxious.       Objective:  Physical Exam   Vitals:   07/14/22 1457  Weight: 238 lb (108 kg)  Height: '5\' 11"'$  (1.803 m)   Gen: well appearing, no acute distress HENT: NCAT, 2 small areas of thrush noted posterior pharynx, neck supple without masses Eyes: PERRL, EOMi Lymph: no cervical lymphadenopathy PULM: CTA B CV: RRR, no mgr, no JVD GI: BS+, soft, nontender, no hsm Derm: no rash or skin breakdown MSK: normal bulk and tone Neuro: A&Ox4, MAEW Psyche: normal mood and affect    CBC    Component Value Date/Time   WBC 6.5 04/09/2014 1105   RBC 4.78 04/09/2014 1105   HGB 11.2 (L) 04/13/2014 1121   HCT 32.7 (L) 04/13/2014 1121   PLT 155 04/09/2014 1105   MCV 91.2 04/09/2014 1105   MCH 31.2 04/09/2014 1105   MCHC 34.2 04/09/2014 1105   RDW 12.5 04/09/2014 1105     Chest imaging: 2015 chest x-ray interpreted as normal  PFT: September 2023 full pulmonary function test from the [facility] interpreted as mild restrictive defect with mild decrease in diffusion capacity however reproducibility of lung volumes and DLCO are not good and test needs to be interpreted with caution  Labs:  Path:  Echo: October 2022 echocardiogram normal LVEF, normal RV size and function, valves okay  Heart Catheterization:       Assessment & Plan:   Gastroesophageal reflux disease, unspecified whether esophagitis present  Mild persistent allergic asthma  Dyspnea, unspecified type  Globus pharyngeus  Discussion: Mr. Sermons presents today for evaluation of a globus sensation in his throat, shortness of breath and occasional mucus production.  He  carries a diagnosis of asthma and has been treated with a dry powder inhaled corticosteroid for several years for the same.  He says symptoms worsened after taking blood pressure medicines which could be seen in the setting of gastroesophageal reflux disease leading to laryngeal irritation.  The dry powder inhaler could be contributing to the globus sensation due to secretions in his throat.  He does have some thrush on exam today.  I explained to him that the differential diagnosis is broad and it may be that he has both asthma as an explanation for the shortness of breath and chest congestion he experience several years ago and reflux causing his symptoms.  Plan: Thrush: Stop Breo Use Advair HFA 2 puffs twice a day Use the nystatin swish, gargle, rinse 3-4 times a day that I prescribed today  Diagnosis of asthma with shortness of breath: Advair as above Full lung function test in our clinic Chest x-ray today  Shortness of breath: Ambulatory oximetry monitoring  Globus sensation in throat: I think this is likely due to gastroesophageal reflux disease Take Protonix 40 mg twice a day x 14 days then daily after that Avoid fatty foods, alcohol, chocolate, caffeine and tobacco products No eating within 3 hours of bedtime  We will see you back in 4 to 6 weeks care of the results of the lung function test or sooner if needed  Immunizations: Immunization History  Administered Date(s) Administered   PFIZER(Purple Top)SARS-COV-2 Vaccination 08/18/2019, 09/08/2019, 05/11/2020     Current Outpatient Medications:    fluticasone-salmeterol (ADVAIR HFA) 115-21 MCG/ACT inhaler, Inhale 2 puffs into the lungs 2 (two) times daily., Disp: 1 each, Rfl: 12   hydrocortisone 2.5 % cream, daily., Disp: , Rfl:    lidocaine (LIDODERM) 5 %, 1 patch., Disp: , Rfl:    loratadine (CLARITIN) 10 MG tablet, Take 10 mg by mouth daily., Disp: , Rfl:    nebivolol (BYSTOLIC) 2.5 MG tablet, TAKE 1 TABLET BY MOUTH  AT  BEDTIME . APPOINTMENT REQUIRED FOR FUTURE REFILLS, Disp: 90 tablet, Rfl: 0   nystatin (MYCOSTATIN) 100000 UNIT/ML suspension, Use as directed 5 mLs (500,000 Units total) in the mouth or throat 4 (four) times daily. Swish, gargle, spit, Disp: 473 mL, Rfl: 1   spironolactone (ALDACTONE) 25 MG tablet, Take 1 tablet (25 mg total) by mouth daily., Disp: 90 tablet, Rfl: 0

## 2022-07-14 NOTE — Patient Instructions (Signed)
Thrush: Stop Breo Use Advair HFA 2 puffs twice a day Use the nystatin swish, gargle, rinse 3-4 times a day that I prescribed today  Diagnosis of asthma with shortness of breath: Advair as above Full lung function test in our clinic Chest x-ray today  Shortness of breath: Ambulatory oximetry monitoring  Globus sensation in throat: I think this is likely due to gastroesophageal reflux disease Take Protonix 40 mg twice a day x 14 days then daily after that Avoid fatty foods, alcohol, chocolate, caffeine and tobacco products No eating within 3 hours of bedtime  We will see you back in 4 to 6 weeks care of the results of the lung function test or sooner if needed

## 2022-07-15 MED ORDER — PANTOPRAZOLE SODIUM 40 MG PO TBEC: DELAYED_RELEASE_TABLET | ORAL | 1 refills | Status: DC

## 2022-07-15 MED ORDER — NYSTATIN 100000 UNIT/ML MT SUSP: 5.0000 mL | Freq: Four times a day (QID) | OROMUCOSAL | 1 refills | Status: AC

## 2022-07-15 MED ORDER — FLUTICASONE-SALMETEROL 115-21 MCG/ACT IN AERO: 2.0000 | INHALATION_SPRAY | Freq: Two times a day (BID) | RESPIRATORY_TRACT | 12 refills | Status: AC

## 2022-07-15 NOTE — Addendum Note (Signed)
Addended by: Valerie Salts on: 07/15/2022 08:41 AM   Modules accepted: Orders

## 2022-08-11 ENCOUNTER — Ambulatory Visit (INDEPENDENT_AMBULATORY_CARE_PROVIDER_SITE_OTHER): Payer: No Typology Code available for payment source | Admitting: Pulmonary Disease

## 2022-08-11 ENCOUNTER — Encounter: Payer: Self-pay | Admitting: Pulmonary Disease

## 2022-08-11 VITALS — BP 136/82 | HR 64 | Temp 98.0°F | Ht 71.0 in | Wt 227.0 lb

## 2022-08-11 DIAGNOSIS — R09A2 Foreign body sensation, throat: Secondary | ICD-10-CM

## 2022-08-11 DIAGNOSIS — R0600 Dyspnea, unspecified: Secondary | ICD-10-CM

## 2022-08-11 DIAGNOSIS — J4530 Mild persistent asthma, uncomplicated: Secondary | ICD-10-CM | POA: Diagnosis not present

## 2022-08-11 DIAGNOSIS — K219 Gastro-esophageal reflux disease without esophagitis: Secondary | ICD-10-CM

## 2022-08-11 LAB — PULMONARY FUNCTION TEST
DL/VA % pred: 92 %
DL/VA: 3.63 ml/min/mmHg/L
DLCO cor % pred: 74 %
DLCO cor: 19.19 ml/min/mmHg
DLCO unc % pred: 74 %
DLCO unc: 19.19 ml/min/mmHg
FEF 25-75 Post: 3.13 L/sec
FEF 25-75 Pre: 3.68 L/sec
FEF2575-%Change-Post: -14 %
FEF2575-%Pred-Post: 135 %
FEF2575-%Pred-Pre: 158 %
FEV1-%Change-Post: 0 %
FEV1-%Pred-Post: 98 %
FEV1-%Pred-Pre: 98 %
FEV1-Post: 3.16 L
FEV1-Pre: 3.14 L
FEV1FVC-%Change-Post: 2 %
FEV1FVC-%Pred-Pre: 112 %
FEV6-%Change-Post: -1 %
FEV6-%Pred-Post: 90 %
FEV6-%Pred-Pre: 92 %
FEV6-Post: 3.75 L
FEV6-Pre: 3.81 L
FEV6FVC-%Pred-Post: 106 %
FEV6FVC-%Pred-Pre: 106 %
FVC-%Change-Post: -1 %
FVC-%Pred-Post: 85 %
FVC-%Pred-Pre: 87 %
FVC-Post: 3.77 L
FVC-Pre: 3.83 L
Post FEV1/FVC ratio: 84 %
Post FEV6/FVC ratio: 100 %
Pre FEV1/FVC ratio: 82 %
Pre FEV6/FVC Ratio: 100 %
RV % pred: 71 %
RV: 1.86 L
TLC % pred: 80 %
TLC: 5.81 L

## 2022-08-11 MED ORDER — PANTOPRAZOLE SODIUM 40 MG PO TBEC: DELAYED_RELEASE_TABLET | ORAL | 11 refills | Status: DC

## 2022-08-11 NOTE — Progress Notes (Signed)
Synopsis: Referred in December 2023 for asthma, has essentially normal lung function testing (small decrease in diffusion capacity) with GERD symptoms.  Subjective:   PATIENT ID: Andrew Rogers GENDER: male DOB: May 23, 1946, MRN: [id number]   HPI  Chief Complaint  Patient presents with   Follow-up    Pft review ,no c/o     Andrew Rogers says that the globus sensation has 97% gone away. He says that the protonix really helped a lot.  Had problems with shortness of breath with the exception of occasional dyspnea with heavy exertion.  He is continuing to take the Advair but he says he is wondering if he actually needs it.  He has been very careful about not eating within 3 hours of bedtime, he is avoiding alcohol, chocolate, caffeine, and fatty foods. He is taking Protonix once daily which she says is helping significantly.  He is sleeping with the head of his bed elevated.  Past Medical History:  Diagnosis Date   Allergic reaction to grass pollen    Anxiety    JUST OVER IMPENDING PROSTATE SURGERY   Bradycardia    Dermatitis    Deviated septum    Disturbance of skin sensation    Dyspepsia    ED (erectile dysfunction)    Hearing loss    Hyperhidrosis    Hypertension    NEW DIAGNOSIS OF HYPERTENSION - STARTED MEDICATION FOR B/P 02/20/14   Hypertensive chronic kidney disease    Internal hemorrhoids    Malignant neoplasm of prostate ([place])    Obesity    Osteoarthritis    Polyp of colon    Prostate cancer ([place]) 06/05/2008   gleason 6   Proteinuria    Sleep apnea    USES CPAP SETTING 7   Urinary incontinence    Urticaria       Review of Systems  Constitutional:  Negative for chills, fever, malaise/fatigue and weight loss.  HENT:  Negative for congestion, sinus pain and sore throat.   Respiratory:  Negative for cough, sputum production and shortness of breath.   Cardiovascular:  Negative for chest pain and leg swelling.     Objective:  Physical Exam   Vitals:   08/11/22  1109  BP: 136/82  Pulse: 64  Temp: 98 F (36.7 C)  TempSrc: Oral  SpO2: 98%  Weight: 227 lb (103 kg)  Height: '5\' 11"'$  (1.803 m)   Gen: well appearing HENT: OP clear, neck supple PULM: CTA B, normal effort  CV: RRR, no mgr GI: BS+, soft, nontender Derm: no cyanosis or rash Psyche: normal mood and affect   CBC    Component Value Date/Time   WBC 6.5 04/09/2014 1105   RBC 4.78 04/09/2014 1105   HGB 11.2 (L) 04/13/2014 1121   HCT 32.7 (L) 04/13/2014 1121   PLT 155 04/09/2014 1105   MCV 91.2 04/09/2014 1105   MCH 31.2 04/09/2014 1105   MCHC 34.2 04/09/2014 1105   RDW 12.5 04/09/2014 1105     Chest imaging: 2015 chest x-ray interpreted as normal December 2023 two-view chest x-ray images independently reviewed showing normal pulmonary parenchyma, normal cardiac silhouette  PFT: September 2023 full pulmonary function test from the [facility] interpreted as mild restrictive defect with mild decrease in diffusion capacity however reproducibility of lung volumes and DLCO are not good and test needs to be interpreted with caution August 11, 2018 for full PFT ratio 84%, FVC 3.77 L 85% predicted, total lung capacity 5.81 L 80% predicted, DLCO 19.19 74%  predicted   Labs: 08/2021 Hgb 15.5 gm/dL  Path:  Echo: October 2022 echocardiogram normal LVEF, normal RV size and function, valves okay  Heart Catheterization:       Assessment & Plan:   Dyspnea, unspecified type  Gastroesophageal reflux disease, unspecified whether esophagitis present  Globus pharyngeus  Discussion: Andrew Rogers is doing quite well with reflux treatment.  In retrospect, I think that acid reflux was the explanation for nearly all of his symptoms as most of them were upper airway in nature.  Based on the fact that he has an essentially completely normal lung function test I see no evidence of underlying asthma so I think it safe for him to stop taking Advair at this time.  He does have a minor  diffusion abnormality.  Considering he has no significant functional limitation and shortness of breath I do not think there is reason to work this up further unless he develops worsening shortness of breath.  We do need to keep an eye on it however to make sure that the diffusion abnormality does not worsen.  Plan: Gastroesophageal reflux disease: Continue taking Protonix as you are doing Continue to avoid fatty foods, chocolate, caffeine, mint, however as we discussed today you could likely reintroduce some of these foods in moderation. Discussed the Protonix and reflux with your primary care physician Dr. Virgina Jock  Shortness of breath with minor diffusion impairment on lung function testing: Exercise is much as possible, stay physically active Stop Advair, I see no evidence of asthma However, if you develop worsening shortness of breath, chest tightness or wheezing then go ahead and restart the Advair Will repeat lung function testing in a year  I will see you back in 1 year or sooner if needed.  Immunizations: Immunization History  Administered Date(s) Administered   PFIZER(Purple Top)SARS-COV-2 Vaccination 08/18/2019, 09/08/2019, 05/11/2020     Current Outpatient Medications:    fluticasone-salmeterol (ADVAIR HFA) 115-21 MCG/ACT inhaler, Inhale 2 puffs into the lungs 2 (two) times daily., Disp: 1 each, Rfl: 12   hydrocortisone 2.5 % cream, daily., Disp: , Rfl:    lidocaine (LIDODERM) 5 %, 1 patch., Disp: , Rfl:    loratadine (CLARITIN) 10 MG tablet, Take 10 mg by mouth daily., Disp: , Rfl:    nebivolol (BYSTOLIC) 2.5 MG tablet, TAKE 1 TABLET BY MOUTH AT BEDTIME . APPOINTMENT REQUIRED FOR FUTURE REFILLS, Disp: 90 tablet, Rfl: 0   nystatin (MYCOSTATIN) 100000 UNIT/ML suspension, Use as directed 5 mLs (500,000 Units total) in the mouth or throat 4 (four) times daily. Swish, gargle, spit, Disp: 473 mL, Rfl: 1   pantoprazole (PROTONIX) 40 MG tablet, Take 1 tablet twice a daily for 14 days,  then 1 tablet daily, Disp: 30 tablet, Rfl: 1   spironolactone (ALDACTONE) 25 MG tablet, Take 1 tablet (25 mg total) by mouth daily., Disp: 90 tablet, Rfl: 0

## 2022-08-11 NOTE — Progress Notes (Signed)
PFT done today. 

## 2022-08-11 NOTE — Patient Instructions (Signed)
Gastroesophageal reflux disease: Continue taking Protonix as you are doing Continue to avoid fatty foods, chocolate, caffeine, mint, however as we discussed today you could likely reintroduce some of these foods in moderation. Discussed the Protonix and reflux with your primary care physician Dr. Virgina Jock  Shortness of breath with minor diffusion impairment on lung function testing: Exercise is much as possible, stay physically active Stop Advair, I see no evidence of asthma However, if you develop worsening shortness of breath, chest tightness or wheezing then go ahead and restart the Advair Will repeat lung function testing in a year  I will see you back in 1 year or sooner if needed.

## 2022-08-11 NOTE — Addendum Note (Signed)
Addended by: Marshia Ly R on: 08/11/2022 11:50 AM   Modules accepted: Orders

## 2022-08-31 ENCOUNTER — Other Ambulatory Visit: Payer: Self-pay

## 2022-08-31 MED ORDER — SPIRONOLACTONE 25 MG PO TABS: 25.0000 mg | ORAL_TABLET | Freq: Every day | ORAL | 0 refills | Status: DC

## 2022-10-01 DIAGNOSIS — Z125 Encounter for screening for malignant neoplasm of prostate: Secondary | ICD-10-CM | POA: Diagnosis not present

## 2022-10-01 DIAGNOSIS — I10 Essential (primary) hypertension: Secondary | ICD-10-CM | POA: Diagnosis not present

## 2022-10-01 LAB — LAB REPORT - SCANNED: EGFR: 99.5

## 2022-10-08 DIAGNOSIS — I10 Essential (primary) hypertension: Secondary | ICD-10-CM | POA: Diagnosis not present

## 2022-10-08 DIAGNOSIS — G4733 Obstructive sleep apnea (adult) (pediatric): Secondary | ICD-10-CM | POA: Diagnosis not present

## 2022-10-08 DIAGNOSIS — N181 Chronic kidney disease, stage 1: Secondary | ICD-10-CM | POA: Diagnosis not present

## 2022-10-08 DIAGNOSIS — E669 Obesity, unspecified: Secondary | ICD-10-CM | POA: Diagnosis not present

## 2022-10-08 DIAGNOSIS — C61 Malignant neoplasm of prostate: Secondary | ICD-10-CM | POA: Diagnosis not present

## 2022-10-08 DIAGNOSIS — Z1331 Encounter for screening for depression: Secondary | ICD-10-CM | POA: Diagnosis not present

## 2022-10-08 DIAGNOSIS — I129 Hypertensive chronic kidney disease with stage 1 through stage 4 chronic kidney disease, or unspecified chronic kidney disease: Secondary | ICD-10-CM | POA: Diagnosis not present

## 2022-10-08 DIAGNOSIS — Z23 Encounter for immunization: Secondary | ICD-10-CM | POA: Diagnosis not present

## 2022-10-08 DIAGNOSIS — R809 Proteinuria, unspecified: Secondary | ICD-10-CM | POA: Diagnosis not present

## 2022-10-08 DIAGNOSIS — Z0000 Encounter for general adult medical examination without abnormal findings: Secondary | ICD-10-CM | POA: Diagnosis not present

## 2022-10-08 DIAGNOSIS — R209 Unspecified disturbances of skin sensation: Secondary | ICD-10-CM | POA: Diagnosis not present

## 2022-10-08 DIAGNOSIS — L562 Photocontact dermatitis [berloque dermatitis]: Secondary | ICD-10-CM | POA: Diagnosis not present

## 2022-11-04 NOTE — Progress Notes (Signed)
Cardiology Office Note   Date:  11/06/2022   ID:  Andrew Rogers, DOB [birth year], MRN [id number]  PCP:  Creola Cornusso, John, MD    No chief complaint on file.  HTN  Wt Readings from Last 3 Encounters:  11/06/22 232 lb 3.2 oz (105.3 kg)  08/11/22 227 lb (103 kg)  07/14/22 238 lb (108 kg)       History of Present Illness: Andrew Ricksrnest L Beste is a 77 y.o. male  former patient of Dr. Katrinka BlazingSmith with a hx of prostate cancer status post robotic resection 2015.  Had cardiac evaluation because of persistent hypertension in 2022.  Cardiovascular risk factors include obstructive sleep apnea, obesity, and primary hypertension.   BP was controlled in 06/2021 at the last visit with Dr. Katrinka BlazingSmith. "He is following detailed blood pressure record and generally has had most blood pressures less than 140/90 and on average in the 130/78 mmHg range. Heart rates tend to run between 45 and 60 bpm. His current medical regimen is nebivolol 2.5 mg daily and spironolactone 25 mg/day. "  Echo in 2022 showed: "Let the patient know the echocardiogram is normal. Therefore, BP has not been a longstanding problem which has caused damage to heart. We simply need to find a therapy regimen that can be taken daily without causing SE and keeps the BP ~ 130/80 mmHg. "  Blood pressures at home in 2024 basically in the 120s to 130s systolic.  Exercise has decreased.  Was doing 3 miles a day.   Stopped nebivolol in 11/23. Had some dizziness with the beta blocker and felt that it made his allergies worse (of note he reported the same with lisinopril regarding the allergies).  Denies : Chest pain. Dizziness. Leg edema. Nitroglycerin use. Orthopnea. Palpitations. Paroxysmal nocturnal dyspnea. Shortness of breath. Syncope.      Past Medical History:  Diagnosis Date   Allergic reaction to grass pollen    Anxiety    JUST OVER IMPENDING PROSTATE SURGERY   Bradycardia    Dermatitis    Deviated septum    Disturbance of skin sensation     Dyspepsia    ED (erectile dysfunction)    Hearing loss    Hyperhidrosis    Hypertension    NEW DIAGNOSIS OF HYPERTENSION - STARTED MEDICATION FOR B/P 02/20/14   Hypertensive chronic kidney disease    Internal hemorrhoids    Malignant neoplasm of prostate    Obesity    Osteoarthritis    Polyp of colon    Prostate cancer 06/05/2008   gleason 6   Proteinuria    Sleep apnea    USES CPAP SETTING 7   Urinary incontinence    Urticaria     Past Surgical History:  Procedure Laterality Date   COLONOSCOPY     LYMPHADENECTOMY Bilateral 04/12/2014   Procedure: BILATERAL LYMPHADENECTOMY;  Surgeon: Heloise PurpuraLester Borden, MD;  Location: [facility];  Service: Urology;  Laterality: Bilateral;   PROSTATE BIOPSY  x 5   Gleason 4+3=7, PSA 5.25 on 12/20/13, volume 65.1 cc   ROBOT ASSISTED LAPAROSCOPIC RADICAL PROSTATECTOMY N/A 04/12/2014   Procedure: ROBOTIC ASSISTED LAPAROSCOPIC RADICAL PROSTATECTOMY (LEVEL 2);  Surgeon: Heloise PurpuraLester Borden, MD;  Location: [facility];  Service: Urology;  Laterality: N/A;   salivary gland surgey     stone removed as teen   TONSILLECTOMY     child     Current Outpatient Medications  Medication Sig Dispense Refill   clindamycin (CLEOCIN T) 1 % lotion APPLY MODERATE AMOUNT TO  AFFECTED AREA DAILY AS NEEDED FOR RASH     fluticasone-salmeterol (ADVAIR HFA) 115-21 MCG/ACT inhaler Inhale 2 puffs into the lungs 2 (two) times daily. 1 each 12   hydrocortisone 2.5 % cream daily.     lidocaine (LIDODERM) 5 % 1 patch.     loratadine (CLARITIN) 10 MG tablet Take 10 mg by mouth daily.     nystatin (MYCOSTATIN) 100000 UNIT/ML suspension Use as directed 5 mLs (500,000 Units total) in the mouth or throat 4 (four) times daily. Swish, gargle, spit 473 mL 1   pantoprazole (PROTONIX) 40 MG tablet Take 1 tablet twice a daily for 14 days, then 1 tablet daily 30 tablet 11   spironolactone (ALDACTONE) 25 MG tablet Take 1 tablet (25 mg total) by mouth daily. 90 tablet 0   nebivolol (BYSTOLIC) 2.5 MG tablet  TAKE 1 TABLET BY MOUTH AT BEDTIME . APPOINTMENT REQUIRED FOR FUTURE REFILLS (Patient not taking: Reported on 11/06/2022) 90 tablet 0   No current facility-administered medications for this visit.    Allergies:   Amlodipine, Bystolic [nebivolol hcl], Clonidine, Clonidine derivatives, Doxycycline, Losartan, Sulfamethoxazole-trimethoprim, Hydralazine, and Lisinopril    Social History:  The patient  reports that he has never smoked. He has never been exposed to tobacco smoke. He has never used smokeless tobacco. He reports that he does not drink alcohol and does not use drugs.   Family History:  The patient's family history includes Cancer in an other family member; Hypertension in his mother; Seizures in his brother.    ROS:  Please see the history of present illness.   Otherwise, review of systems are positive for cramps in fingers and toes.   All other systems are reviewed and negative.    PHYSICAL EXAM: VS:  BP (!) 152/92   Pulse 66   Ht  (1.803 m)   Wt 232 lb 3.2 oz (105.3 kg)   SpO2 94%   BMI 32.39 kg/m  , BMI Body mass index is 32.39 kg/m. GEN: Well nourished, well developed, in no acute distress HEENT: normal Neck: no JVD, carotid bruits, or masses Cardiac: RRR; no murmurs, rubs, or gallops,no edema  Respiratory:  clear to auscultation bilaterally, normal work of breathing GI: soft, nontender, nondistended, + BS MS: no deformity or atrophy Skin: warm and dry, no rash Neuro:  Strength and sensation are intact Psych: euthymic mood, full affect   EKG:   The ekg ordered today demonstrates NSR, LAD, no ST changes   Recent Labs: No results found for requested labs within last 365 days.   Lipid Panel No results found for: "CHOL", "TRIG", "HDL", "CHOLHDL", "VLDL", "LDLCALC", "LDLDIRECT"   Other studies Reviewed: Additional studies/ records that were reviewed today with results demonstrating: .   ASSESSMENT AND PLAN:  HTN: Whole food, plant-based diet. high fiber  diet.  Avoid processed foods and excessive salt. Stopped nebivolol in 11/23. Had some dizziness with the beta blocker and felt that it made his allergies worse (of note he reported the same with lisinopril regarding the allergies).  Takes it only prn.  Discussed adding Coreg 3.125 mg BID but he prefers to try to increase exercise .  Has a history of high readings in the doctor's office.  Home readings may show a slight increase with more readings in the 135 range.  Hopefully, with more regular exercise, he will get it back down into the 1 25-1 30 range systolic. OSA: On CPAP.  Tolerating this well.  Obesity:  Dietary recs as  above.  Increase exercise to target below .  Incorporating 2 days of resistance training to help with cholesterol, blood sugar management and prevent muscle loss. Calcium scoring CT for screening.  He is agreeable.  If calcium score elevated, would recommend statin as his LDL was 104.   Current medicines are reviewed at length with the patient today.  The patient concerns regarding his medicines were addressed.  The following changes have been made:  No change  Labs/ tests ordered today include:  No orders of the defined types were placed in this encounter.   Recommend 150 minutes/week of aerobic exercise Low fat, low carb, high fiber diet recommended  Disposition:   FU in 1 year   Signed, Lance Muss, MD  11/06/2022 9:17 AM    [facility] [address] [place], [place], [place]  [address] Phone: [phone]; Fax: [phone]

## 2022-11-06 ENCOUNTER — Encounter: Payer: Self-pay | Admitting: Interventional Cardiology

## 2022-11-06 ENCOUNTER — Ambulatory Visit: Payer: Medicare PPO | Attending: Interventional Cardiology | Admitting: Interventional Cardiology

## 2022-11-06 VITALS — BP 152/86 | HR 66 | Ht 71.0 in | Wt 232.2 lb

## 2022-11-06 DIAGNOSIS — E669 Obesity, unspecified: Secondary | ICD-10-CM | POA: Diagnosis not present

## 2022-11-06 DIAGNOSIS — I10 Essential (primary) hypertension: Secondary | ICD-10-CM | POA: Diagnosis not present

## 2022-11-06 DIAGNOSIS — Z6832 Body mass index (BMI) 32.0-32.9, adult: Secondary | ICD-10-CM

## 2022-11-06 DIAGNOSIS — G4733 Obstructive sleep apnea (adult) (pediatric): Secondary | ICD-10-CM | POA: Diagnosis not present

## 2022-11-06 MED ORDER — NEBIVOLOL HCL 2.5 MG PO TABS: 2.5000 mg | ORAL_TABLET | Freq: Every day | ORAL | 3 refills | Status: DC | PRN

## 2022-11-06 NOTE — Patient Instructions (Signed)
Medication Instructions:  Your physician recommends that you continue on your current medications as directed. Please refer to the Current Medication list given to you today.  *If you need a refill on your cardiac medications before your next appointment, please call your pharmacy*   Lab Work: none If you have labs (blood work) drawn today and your tests are completely normal, you will receive your results only by: MyChart Message (if you have MyChart) OR A paper copy in the mail If you have any lab test that is abnormal or we need to change your treatment, we will call you to review the results.   Testing/Procedures: Dr Eldridge Dace recommends you have a Calcium Score CT Scan   Follow-Up: At [facility], you and your health needs are our priority.  As part of our continuing mission to provide you with exceptional heart care, we have created designated Provider Care Teams.  These Care Teams include your primary Cardiologist (physician) and Advanced Practice Providers (APPs -  Physician Assistants and Nurse Practitioners) who all work together to provide you with the care you need, when you need it.  We recommend signing up for the patient portal called "MyChart".  Sign up information is provided on this After Visit Summary.  MyChart is used to connect with patients for Virtual Visits (Telemedicine).  Patients are able to view lab/test results, encounter notes, upcoming appointments, etc.  Non-urgent messages can be sent to your provider as well.   To learn more about what you can do with MyChart, go to [email or web address].    Your next appointment:   12 month(s)  Provider:   Lance Muss, MD     Other Instructions

## 2022-11-09 NOTE — Addendum Note (Signed)
Addended by: Lacy Duverney R on: 11/09/2022 01:16 PM   Modules accepted: Orders

## 2022-11-25 ENCOUNTER — Ambulatory Visit (HOSPITAL_COMMUNITY)
Admission: RE | Admit: 2022-11-25 | Discharge: 2022-11-25 | Disposition: A | Discharge status: Home / Self Care | Payer: Medicare PPO | Source: Ambulatory Visit | Attending: Interventional Cardiology | Admitting: Interventional Cardiology

## 2022-11-25 ENCOUNTER — Other Ambulatory Visit: Payer: Self-pay | Admitting: Interventional Cardiology

## 2022-11-25 DIAGNOSIS — G4733 Obstructive sleep apnea (adult) (pediatric): Secondary | ICD-10-CM | POA: Insufficient documentation

## 2022-11-25 DIAGNOSIS — I10 Essential (primary) hypertension: Secondary | ICD-10-CM | POA: Insufficient documentation

## 2022-11-27 NOTE — Progress Notes (Signed)
Calcium score is still quite low.  COntinue healthy lifestyle.

## 2023-03-15 ENCOUNTER — Telehealth: Payer: Self-pay | Admitting: Interventional Cardiology

## 2023-03-15 NOTE — Telephone Encounter (Signed)
I spoke with patient.  He reports for several months he has been having tingling and numbness in his arm, hand and fingers.  Occurs sometimes on the right side and some times on the left side. Reports it causes difficulty sleeping.  He has not discussed this episode of tingling with his PCP but had similar episode several years ago and PCP changed BP medication with improvement in symptoms. This was prior to seeing cardiology.  Tingling symptoms have now returned and patient is asking if this could be related to spironolactone.  Has been on spironolactone for several years but patient reports his system is funny and after awhile he can't take certain medications long term. Reports BP today is 138/90 and heart rate is 53.  Has not taken his medications yet today.  I advised him to take his medications for today. On 8/14 BP was 131/82 and on 7/29 BP was 129/74 and heart rate was 61.  He did take medications on 8/14 and 7/29 I asked patient to follow up with PCP regarding tingling and numbness and told him I would send message to Dr Eldridge Dace to see if he felt symptoms were related to medication and if any changes recommended.

## 2023-03-15 NOTE — Telephone Encounter (Signed)
Pt c/o medication issue:  1. Name of Medication: spironolactone (ALDACTONE) 25 MG tablet   2. How are you currently taking this medication (dosage and times per day)?    3. Are you having a reaction (difficulty breathing--STAT)? no  4. What is your medication issue? Numbness in fingers, forearm and some in the shoulder. Would like to switch medication. Please advise

## 2023-03-15 NOTE — Telephone Encounter (Signed)
Agree with f/u with PCP.

## 2023-03-18 NOTE — Telephone Encounter (Signed)
Patient notified.  He will follow up with PCP

## 2023-03-18 NOTE — Telephone Encounter (Signed)
Left message to call office

## 2023-04-02 ENCOUNTER — Telehealth: Payer: Self-pay | Admitting: Interventional Cardiology

## 2023-04-02 NOTE — Telephone Encounter (Signed)
Patient states he is having multiple issues with his medications.  Patient states he was advised by Dr. Eldridge Dace last month to speak to his PCP about tingling and numbness in his arm/hand/fingers. He was concerned this was from spironolactone.  Patient states he stopped taking spironolactone and in 3 days his symptoms improved. Patient also reports he started taking Doxazosin 2mg  daily on 03/24/23. He reports BP has been increasing and symptoms of numbness/tingling of right hand, swelling in left ankle and joint pain has returned (similar to when he was taking spironolactone).  Recent BP/HR readings: 133/81, 56 136/81, 58 141/86, 56 169/91, 51  Patient reports BP has been averaging 140's-160's systolic on Doxazosin.   Patient has an appt with Dr. Eldridge Dace on 04/16/2023 to discuss further. Patient would like to know if Dr. Eldridge Dace has any recommendations for him before his appointment. He said he can deal with the symptoms until then if he has to, but would rather not and is also concerned about his BP readings.  Will forward to Dr. Eldridge Dace to review and advise.

## 2023-04-02 NOTE — Telephone Encounter (Signed)
Patient is calling because he has been having issues with his medications. Patient had spoken with his Pcp and they are still having issues with finding the right medications for him. Please advise.

## 2023-04-15 NOTE — Progress Notes (Addendum)
Cardiology Office Note   Date:  04/16/2023   ID:  Andrew Rogers 1946/05/18, MRN [id number]  PCP:  Creola Corn, MD    No chief complaint on file.  HTN  Wt Readings from Last 3 Encounters:  04/16/23 226 lb (102.5 kg)  11/06/22 232 lb 3.2 oz (105.3 kg)  08/11/22 227 lb (103 kg)       History of Present Illness: Andrew Rogers is a 77 y.o. male   former patient of Dr. Katrinka Blazing with a hx of prostate cancer status post robotic resection 2015.  Had cardiac evaluation because of persistent hypertension in 2022.  Cardiovascular risk factors include obstructive sleep apnea, obesity, and primary hypertension.    BP was controlled in 06/2021 at the last visit with Dr. Katrinka Blazing. "He is following detailed blood pressure record and generally has had most blood pressures less than 140/90 and on average in the 130/78 mmHg range. Heart rates tend to run between 45 and 60 bpm. His current medical regimen is nebivolol 2.5 mg daily and spironolactone 25 mg/day. "   Echo in 2022 showed: "Let the patient know the echocardiogram is normal. Therefore, BP has not been a longstanding problem which has caused damage to heart. We simply need to find a therapy regimen that can be taken daily without causing SE and keeps the BP ~ 130/80 mmHg. "   Blood pressures at home in 2024 basically in the 120s to 130s systolic.  Exercise has decreased.  Was doing 3 miles a day.    Stopped nebivolol in 11/23. Had some dizziness with the beta blocker and felt that it made his allergies worse (of note he reported the same with lisinopril regarding the allergies).   At the last visit, we discussed adding Coreg 3.125 mg BID but he prefers to try to increase exercise .   Called in to the office on 04/02/23: "Patient states he is having multiple issues with his medications.   Patient states he was advised by Dr. Eldridge Dace last month to speak to his PCP about tingling and numbness in his arm/hand/fingers. He was concerned this  was from spironolactone.   "Patient states he stopped taking spironolactone and in 3 days his symptoms improved. Patient also reports he started taking Doxazosin 2mg  daily on 03/24/23. He reports BP has been increasing and symptoms of numbness/tingling of right hand, swelling in left ankle and joint pain has returned (similar to when he was taking spironolactone).   Recent BP/HR readings: 133/81, 56 136/81, 58 141/86, 56 169/91, 51   Patient reports BP has been averaging 140's-160's systolic on Doxazosin.    Patient has an appt with Dr. Eldridge Dace on 04/16/2023 to discuss further. "  He has gone to spironolactone every other day or every other third day.    He brings in a list with side effects from beta blockers, ACE-I, ARBs, calcium channel blockers.  Denies : Chest pain. Leg edema. Nitroglycerin use. Orthopnea. Palpitations. Paroxysmal nocturnal dyspnea. Shortness of breath. Syncope.    Past Medical History:  Diagnosis Date   Allergic reaction to grass pollen    Anxiety    JUST OVER IMPENDING PROSTATE SURGERY   Bradycardia    Dermatitis    Deviated septum    Disturbance of skin sensation    Dyspepsia    ED (erectile dysfunction)    Hearing loss    Hyperhidrosis    Hypertension    NEW DIAGNOSIS OF HYPERTENSION - STARTED MEDICATION FOR B/P 02/20/14  Hypertensive chronic kidney disease    Internal hemorrhoids    Malignant neoplasm of prostate (HCC)    Obesity    Osteoarthritis    Polyp of colon    Prostate cancer (HCC) 06/05/2008   gleason 6   Proteinuria    Sleep apnea    USES CPAP SETTING 7   Urinary incontinence    Urticaria     Past Surgical History:  Procedure Laterality Date   COLONOSCOPY     LYMPHADENECTOMY Bilateral 04/12/2014   Procedure: BILATERAL LYMPHADENECTOMY;  Surgeon: Heloise Purpura, MD;  Location: [facility];  Service: Urology;  Laterality: Bilateral;   PROSTATE BIOPSY  x 5   Gleason 4+3=7, PSA 5.25 on 12/20/13, volume 65.1 cc   ROBOT ASSISTED  LAPAROSCOPIC RADICAL PROSTATECTOMY N/A 04/12/2014   Procedure: ROBOTIC ASSISTED LAPAROSCOPIC RADICAL PROSTATECTOMY (LEVEL 2);  Surgeon: Heloise Purpura, MD;  Location: [facility];  Service: Urology;  Laterality: N/A;   salivary gland surgey     stone removed as teen   TONSILLECTOMY     child     Current Outpatient Medications  Medication Sig Dispense Refill   clindamycin (CLEOCIN T) 1 % lotion APPLY MODERATE AMOUNT TO AFFECTED AREA DAILY AS NEEDED FOR RASH     doxazosin (CARDURA) 2 MG tablet Take 2 mg by mouth daily.     fluticasone-salmeterol (ADVAIR HFA) 115-21 MCG/ACT inhaler Inhale 2 puffs into the lungs 2 (two) times daily. 1 each 12   hydrocortisone 2.5 % cream daily.     lidocaine (LIDODERM) 5 % 1 patch.     loratadine (CLARITIN) 10 MG tablet Take 10 mg by mouth daily.     nebivolol (BYSTOLIC) 2.5 MG tablet Take 1 tablet (2.5 mg total) by mouth daily as needed (at bedtime). 90 tablet 3   nystatin (MYCOSTATIN) 100000 UNIT/ML suspension Use as directed 5 mLs (500,000 Units total) in the mouth or throat 4 (four) times daily. Swish, gargle, spit 473 mL 1   pantoprazole (PROTONIX) 40 MG tablet Take 1 tablet twice a daily for 14 days, then 1 tablet daily 30 tablet 11   spironolactone (ALDACTONE) 25 MG tablet Take 25 mg by mouth. Every other day or every other day     No current facility-administered medications for this visit.    Allergies:   Doxazosin, Doxycycline, Hydralazine, Labetalol, Nebivolol hcl, Amlodipine, Clonidine, Clonidine derivatives, Losartan, Spironolactone, Sulfamethoxazole-trimethoprim, Wixela inhub [fluticasone-salmeterol], and Lisinopril    Social History:  The patient  reports that he has never smoked. He has never been exposed to tobacco smoke. He has never used smokeless tobacco. He reports that he does not drink alcohol and does not use drugs.   Family History:  The patient's family history includes Cancer in an other family member; Hypertension in his mother;  Seizures in his brother.    ROS:  Please see the history of present illness.   Otherwise, review of systems are positive for numbness, better on les frequent spironolactone.   All other systems are reviewed and negative.    PHYSICAL EXAM: VS:  BP (!) 184/96   Pulse 61   Ht 5\' 11"  (1.803 m)   Wt 226 lb (102.5 kg)   SpO2 96%   BMI 31.52 kg/m  , BMI Body mass index is 31.52 kg/m. GEN: Well nourished, well developed, in no acute distress HEENT: normal Neck: no JVD, carotid bruits, or masses Cardiac: RRR; no murmurs, rubs, or gallops,no edema  Respiratory:  clear to auscultation bilaterally, normal work of breathing GI:  soft, nontender, nondistended, + BS MS: no deformity or atrophy Skin: warm and dry, no rash Neuro:  Strength and sensation are intact Psych: euthymic mood, full affect    Recent Labs: No results found for requested labs within last 365 days.   Lipid Panel No results found for: "CHOL", "TRIG", "HDL", "CHOLHDL", "VLDL", "LDLCALC", "LDLDIRECT"   Other studies Reviewed: Additional studies/ records that were reviewed today with results demonstrating: labs from Dr. Timothy Lasso in March 2024 LDL 104 HDL 41 total cholesterol 159 triglycerides 71 creatinine 0.9 potassium 4.1 AST 21 ALT 26 hemoglobin 15.1.   ASSESSMENT AND PLAN:  HTN: Multiple intolerances.  Start clorthalidone 25 mg daily.  BMet in 1 week.  Many of the intolerances are not true allergies.  Some of them are quite vague.  I explained him that we are running out of classes of medications to use to treat his blood pressure.  At some point, he would have to decide whether or not the side effect was manageable as long as it was not a true allergy. OSA: Using CPAP.  Obesity: walks regularly.  Minimize salt in the diet.  Avoid processed foods.  Minimal coronary artery calcification: 5/24- 9th percentile   Current medicines are reviewed at length with the patient today.  The patient concerns regarding his medicines  were addressed.  The following changes have been made:  as above  Labs/ tests ordered today include: BMet No orders of the defined types were placed in this encounter.   Recommend 150 minutes/week of aerobic exercise Low fat, low carb, high fiber diet recommended  Disposition:   FU with Dr. Duke Salvia in HTN clinic   Signed, Lance Muss, MD  04/16/2023 1:59 PM    [facility] [address] [place], [place], [place]  [address] Phone: [phone]; Fax: [phone]

## 2023-04-16 ENCOUNTER — Encounter: Payer: Self-pay | Admitting: Interventional Cardiology

## 2023-04-16 ENCOUNTER — Ambulatory Visit: Payer: Medicare PPO | Attending: Interventional Cardiology | Admitting: Interventional Cardiology

## 2023-04-16 VITALS — BP 158/78 | HR 61 | Ht 71.0 in | Wt 226.0 lb

## 2023-04-16 DIAGNOSIS — I10 Essential (primary) hypertension: Secondary | ICD-10-CM | POA: Diagnosis not present

## 2023-04-16 DIAGNOSIS — E669 Obesity, unspecified: Secondary | ICD-10-CM

## 2023-04-16 DIAGNOSIS — G4733 Obstructive sleep apnea (adult) (pediatric): Secondary | ICD-10-CM

## 2023-04-16 MED ORDER — CHLORTHALIDONE 25 MG PO TABS: 25.0000 mg | ORAL_TABLET | Freq: Every day | ORAL | 1 refills | Status: DC

## 2023-04-16 NOTE — Patient Instructions (Signed)
Medication Instructions:  Your physician has recommended you make the following change in your medication: Start chlorthalidone 25 mg by mouth daily   *If you need a refill on your cardiac medications before your next appointment, please call your pharmacy*   Lab Work: Your physician recommends that you return for lab work in: one week.  BMP  If you have labs (blood work) drawn today and your tests are completely normal, you will receive your results only by: MyChart Message (if you have MyChart) OR A paper copy in the mail If you have any lab test that is abnormal or we need to change your treatment, we will call you to review the results.   Testing/Procedures: none   Follow-Up: At [facility], you and your health needs are our priority.  As part of our continuing mission to provide you with exceptional heart care, we have created designated Provider Care Teams.  These Care Teams include your primary Cardiologist (physician) and Advanced Practice Providers (APPs -  Physician Assistants and Nurse Practitioners) who all work together to provide you with the care you need, when you need it.  We recommend signing up for the patient portal called "MyChart".  Sign up information is provided on this After Visit Summary.  MyChart is used to connect with patients for Virtual Visits (Telemedicine).  Patients are able to view lab/test results, encounter notes, upcoming appointments, etc.  Non-urgent messages can be sent to your provider as well.   To learn more about what you can do with MyChart, go to [email or web address].    Your next appointment:   Please schedule first available with Dr Duke Salvia in hypertension clinic  Provider:   Chilton Si, MD    Other Instructions  You have been referred to Dr Duke Salvia in the hypertension clinic

## 2023-04-23 ENCOUNTER — Ambulatory Visit: Payer: Medicare PPO | Attending: Interventional Cardiology

## 2023-04-23 DIAGNOSIS — I10 Essential (primary) hypertension: Secondary | ICD-10-CM | POA: Diagnosis not present

## 2023-04-23 LAB — BASIC METABOLIC PANEL
BUN/Creatinine Ratio: 12 (ref 10–24)
BUN: 12 mg/dL (ref 8–27)
CO2: 26 mmol/L (ref 20–29)
Calcium: 9.1 mg/dL (ref 8.6–10.2)
Chloride: 97 mmol/L (ref 96–106)
Creatinine, Ser: 1.04 mg/dL (ref 0.76–1.27)
Glucose: 93 mg/dL (ref 70–99)
Potassium: 3.8 mmol/L (ref 3.5–5.2)
Sodium: 137 mmol/L (ref 134–144)
eGFR: 74 mL/min/{1.73_m2} (ref >59)

## 2023-05-18 ENCOUNTER — Other Ambulatory Visit (HOSPITAL_BASED_OUTPATIENT_CLINIC_OR_DEPARTMENT_OTHER): Payer: Self-pay

## 2023-05-18 DIAGNOSIS — Z8546 Personal history of malignant neoplasm of prostate: Secondary | ICD-10-CM | POA: Diagnosis not present

## 2023-05-18 DIAGNOSIS — C61 Malignant neoplasm of prostate: Secondary | ICD-10-CM | POA: Diagnosis not present

## 2023-05-18 MED ORDER — COMIRNATY 30 MCG/0.3ML IM SUSY
0.3000 mL | PREFILLED_SYRINGE | Freq: Once | INTRAMUSCULAR | 0 refills | Status: AC
  Filled 2023-05-18: qty 0.3, 1d supply, fill #0

## 2023-05-25 DIAGNOSIS — C61 Malignant neoplasm of prostate: Secondary | ICD-10-CM | POA: Diagnosis not present

## 2023-07-07 ENCOUNTER — Ambulatory Visit (HOSPITAL_BASED_OUTPATIENT_CLINIC_OR_DEPARTMENT_OTHER): Payer: Medicare PPO | Admitting: Cardiovascular Disease

## 2023-07-07 ENCOUNTER — Encounter (HOSPITAL_BASED_OUTPATIENT_CLINIC_OR_DEPARTMENT_OTHER): Payer: Self-pay | Admitting: Cardiovascular Disease

## 2023-07-07 VITALS — BP 135/88 | HR 66 | Ht 70.5 in | Wt 225.1 lb

## 2023-07-07 DIAGNOSIS — I10 Essential (primary) hypertension: Secondary | ICD-10-CM

## 2023-07-07 NOTE — Patient Instructions (Signed)
Medication Instructions:  Your physician recommends that you continue on your current medications as directed. Please refer to the Current Medication list given to you today.  *If you need a refill on your cardiac medications before your next appointment, please call your pharmacy*  Lab Work: NONE  Testing/Procedures: NONE  Follow-Up: At [facility], you and your health needs are our priority.  As part of our continuing mission to provide you with exceptional heart care, we have created designated Provider Care Teams.  These Care Teams include your primary Cardiologist (physician) and Advanced Practice Providers (APPs -  Physician Assistants and Nurse Practitioners) who all work together to provide you with the care you need, when you need it.  We recommend signing up for the patient portal called "MyChart".  Sign up information is provided on this After Visit Summary.  MyChart is used to connect with patients for Virtual Visits (Telemedicine).  Patients are able to view lab/test results, encounter notes, upcoming appointments, etc.  Non-urgent messages can be sent to your provider as well.   To learn more about what you can do with MyChart, go to [email or web address].    Your next appointment:   6 month(s)  The format for your next appointment:   In Person  Provider:   Dr Duke Salvia or Ronn Melena NP in [facility]

## 2023-07-07 NOTE — Progress Notes (Unsigned)
Advanced Hypertension Clinic Initial Assessment:    Date:  07/07/2023   ID:  Andrew Rogers, DOB 1946/01/16, MRN [id number]  PCP:  Creola Corn, MD  Cardiologist:  Lance Muss, MD  Nephrologist:  Referring MD: Corky Crafts, MD   CC: Hypertension  History of Present Illness:    Andrew Rogers is a 77 y.o. male with a hx of hypertension, OSA, bradycardia, and prostate cancer status post robotic resection here to establish care in the Advanced Hypertension Clinic.  He was previously a patient of Dr. Katrinka Blazing and then Dr. Eldridge Dace.  He was last seen 03/2023 and home blood pressures were in the 120s to 130s.  Nebivolol was discontinued due to bradycardia and dizziness.  There was a discussion of starting carvedilol but he wanted to work on diet and exercise.  He then stopped taking spironolactone due to numbness and tingling in his arms, hands, and fingers.  He reported that his symptoms resolved.  He was started on doxazosin and again reported numbness and tingling in his hands as well as swelling.  He decided to take his spironolactone every other day or every third day.  At the visit 03/2023 blood pressure was 184/96.  He was started on chlorthalidone and referred to the advanced hypertension clinic.  He had a coronary calcium score 11/2022 that was 1.22.  This was 9th percentile for age and gender.  Echo 04/2021 revealed LVEF 60-65% with normal diastolic function.  Previous antihypertensives: Beta blockers ACE inhibitor ARB Calcium channel blocker  Past Medical History:  Diagnosis Date   Allergic reaction to grass pollen    Anxiety    JUST OVER IMPENDING PROSTATE SURGERY   Bradycardia    Dermatitis    Deviated septum    Disturbance of skin sensation    Dyspepsia    ED (erectile dysfunction)    Hearing loss    Hyperhidrosis    Hypertension    NEW DIAGNOSIS OF HYPERTENSION - STARTED MEDICATION FOR B/P 02/20/14   Hypertensive chronic kidney disease    Internal  hemorrhoids    Malignant neoplasm of prostate (HCC)    Obesity    Osteoarthritis    Polyp of colon    Prostate cancer (HCC) 06/05/2008   gleason 6   Proteinuria    Sleep apnea    USES CPAP SETTING 7   Urinary incontinence    Urticaria     Past Surgical History:  Procedure Laterality Date   COLONOSCOPY     LYMPHADENECTOMY Bilateral 04/12/2014   Procedure: BILATERAL LYMPHADENECTOMY;  Surgeon: Heloise Purpura, MD;  Location: [facility];  Service: Urology;  Laterality: Bilateral;   PROSTATE BIOPSY  x 5   Gleason 4+3=7, PSA 5.25 on 12/20/13, volume 65.1 cc   ROBOT ASSISTED LAPAROSCOPIC RADICAL PROSTATECTOMY N/A 04/12/2014   Procedure: ROBOTIC ASSISTED LAPAROSCOPIC RADICAL PROSTATECTOMY (LEVEL 2);  Surgeon: Heloise Purpura, MD;  Location: [facility];  Service: Urology;  Laterality: N/A;   salivary gland surgey     stone removed as teen   TONSILLECTOMY     child    Current Medications: No outpatient medications have been marked as taking for the 07/07/23 encounter (Appointment) with Chilton Si, MD.     Allergies:   Doxazosin, Doxycycline, Hydralazine, Labetalol, Nebivolol hcl, Amlodipine, Clonidine, Clonidine derivatives, Losartan, Spironolactone, Sulfamethoxazole-trimethoprim, Wixela inhub [fluticasone-salmeterol], and Lisinopril   Social History   Socioeconomic History   Marital status: Married    Spouse name: Not on file   Number of children: Not on  file   Years of education: Not on file   Highest education level: Not on file  Occupational History   Not on file  Tobacco Use   Smoking status: Never    Passive exposure: Never   Smokeless tobacco: Never  Substance and Sexual Activity   Alcohol use: No   Drug use: No   Sexual activity: Not on file  Other Topics Concern   Not on file  Social History Narrative   Not on file   Social Determinants of Health   Financial Resource Strain: Not on file  Food Insecurity: Not on file  Transportation Needs: Not on file  Physical  Activity: Not on file  Stress: Not on file  Social Connections: Not on file     Family History: The patient's ***family history includes Cancer in an other family member; Hypertension in his mother; Seizures in his brother.  ROS:   Please see the history of present illness.    *** All other systems reviewed and are negative.  EKGs/Labs/Other Studies Reviewed:    EKG:  EKG is *** ordered today.  The ekg ordered today demonstrates ***  Recent Labs: 04/23/2023: BUN 12; Creatinine, Ser 1.04; Potassium 3.8; Sodium 137   Recent Lipid Panel No results found for: "CHOL", "TRIG", "HDL", "CHOLHDL", "VLDL", "LDLCALC", "LDLDIRECT"  Physical Exam:   VS:  There were no vitals taken for this visit. , BMI There is no height or weight on file to calculate BMI. GENERAL:  Well appearing HEENT: Pupils equal round and reactive, fundi not visualized, oral mucosa unremarkable NECK:  No jugular venous distention, waveform within normal limits, carotid upstroke brisk and symmetric, no bruits, no thyromegaly LYMPHATICS:  No cervical adenopathy LUNGS:  Clear to auscultation bilaterally HEART:  RRR.  PMI not displaced or sustained,S1 and S2 within normal limits, no S3, no S4, no clicks, no rubs, *** murmurs ABD:  Flat, positive bowel sounds normal in frequency in pitch, no bruits, no rebound, no guarding, no midline pulsatile mass, no hepatomegaly, no splenomegaly EXT:  2 plus pulses throughout, no edema, no cyanosis no clubbing SKIN:  No rashes no nodules NEURO:  Cranial nerves II through XII grossly intact, motor grossly intact throughout PSYCH:  Cognitively intact, oriented to person place and time   ASSESSMENT/PLAN:    No problem-specific Assessment & Plan notes found for this encounter.   Screening for Secondary Hypertension: { Click here to document screening for secondary causes of HTN  :[id number]}    Relevant Labs/Studies:    Latest Ref Rng & Units 04/23/2023   11:39 AM 05/09/2021   10:00  AM 04/09/2014   11:05 AM  Basic Labs  Sodium 134 - 144 mmol/L 137  141  140   Potassium 3.5 - 5.2 mmol/L 3.8  4.1  4.3   Creatinine 0.76 - 1.27 mg/dL 2.53  6.64  4.03      Disposition:    FU with MD/PharmD in {gen number [id number]} {Days to years:10300}    Medication Adjustments/Labs and Tests Ordered: Current medicines are reviewed at length with the patient today.  Concerns regarding medicines are outlined above.  No orders of the defined types were placed in this encounter.  No orders of the defined types were placed in this encounter.    Signed, Chilton Si, MD  07/07/2023 12:51 PM    [facility]

## 2023-07-27 ENCOUNTER — Encounter (HOSPITAL_BASED_OUTPATIENT_CLINIC_OR_DEPARTMENT_OTHER): Payer: Self-pay | Admitting: Cardiovascular Disease

## 2023-07-29 ENCOUNTER — Ambulatory Visit: Payer: Medicare PPO | Admitting: Primary Care

## 2023-10-07 DIAGNOSIS — C61 Malignant neoplasm of prostate: Secondary | ICD-10-CM | POA: Diagnosis not present

## 2023-10-07 DIAGNOSIS — I129 Hypertensive chronic kidney disease with stage 1 through stage 4 chronic kidney disease, or unspecified chronic kidney disease: Secondary | ICD-10-CM | POA: Diagnosis not present

## 2023-10-07 DIAGNOSIS — N181 Chronic kidney disease, stage 1: Secondary | ICD-10-CM | POA: Diagnosis not present

## 2023-10-14 DIAGNOSIS — R809 Proteinuria, unspecified: Secondary | ICD-10-CM | POA: Diagnosis not present

## 2023-10-14 DIAGNOSIS — J4520 Mild intermittent asthma, uncomplicated: Secondary | ICD-10-CM | POA: Diagnosis not present

## 2023-10-14 DIAGNOSIS — N181 Chronic kidney disease, stage 1: Secondary | ICD-10-CM | POA: Diagnosis not present

## 2023-10-14 DIAGNOSIS — R931 Abnormal findings on diagnostic imaging of heart and coronary circulation: Secondary | ICD-10-CM | POA: Diagnosis not present

## 2023-10-14 DIAGNOSIS — I129 Hypertensive chronic kidney disease with stage 1 through stage 4 chronic kidney disease, or unspecified chronic kidney disease: Secondary | ICD-10-CM | POA: Diagnosis not present

## 2023-10-14 DIAGNOSIS — R82998 Other abnormal findings in urine: Secondary | ICD-10-CM | POA: Diagnosis not present

## 2023-10-14 DIAGNOSIS — R209 Unspecified disturbances of skin sensation: Secondary | ICD-10-CM | POA: Diagnosis not present

## 2023-10-14 DIAGNOSIS — E669 Obesity, unspecified: Secondary | ICD-10-CM | POA: Diagnosis not present

## 2023-10-14 DIAGNOSIS — L562 Photocontact dermatitis [berloque dermatitis]: Secondary | ICD-10-CM | POA: Diagnosis not present

## 2023-10-14 DIAGNOSIS — Z0000 Encounter for general adult medical examination without abnormal findings: Secondary | ICD-10-CM | POA: Diagnosis not present

## 2024-01-12 ENCOUNTER — Ambulatory Visit (HOSPITAL_BASED_OUTPATIENT_CLINIC_OR_DEPARTMENT_OTHER): Admitting: Cardiovascular Disease

## 2024-01-12 ENCOUNTER — Encounter (HOSPITAL_BASED_OUTPATIENT_CLINIC_OR_DEPARTMENT_OTHER): Payer: Self-pay | Admitting: Cardiovascular Disease

## 2024-01-12 VITALS — BP 142/68 | HR 54 | Wt 232.8 lb

## 2024-01-12 DIAGNOSIS — I10 Essential (primary) hypertension: Secondary | ICD-10-CM

## 2024-01-12 MED ORDER — CHLORTHALIDONE 25 MG PO TABS: 25.0000 mg | ORAL_TABLET | Freq: Every day | ORAL | 3 refills | Status: AC

## 2024-01-12 NOTE — Patient Instructions (Signed)
 Medication Instructions:  Your physician recommends that you continue on your current medications as directed. Please refer to the Current Medication list given to you today.   Labwork: NONE  Testing/Procedures: NONE  Follow-Up: 1 YEAR WITH DR [place], CAITLIN W NP, OR MICHELLE S NP   If you need a refill on your cardiac medications before your next appointment, please call your pharmacy.

## 2024-01-12 NOTE — Progress Notes (Signed)
 Advanced Hypertension Clinic Initial Assessment:    Date:  01/12/2024   ID:  Andrew Rogers, DOB 17-Jan-1946, MRN [id number]  PCP:  Margarete Sharps, MD  Cardiologist:  Avery Bodo, MD  Nephrologist:  Referring MD: Margarete Sharps, MD   CC: Hypertension  History of Present Illness:    Andrew Rogers is a 78 y.o. male with a hx of hypertension, OSA, bradycardia, and prostate cancer status post robotic resection here to establish care in the Advanced Hypertension Clinic.  He was previously a patient of Dr. Felipe Horton and then Dr. Jacquelynn Matter.  He was last seen 03/2023 and home blood pressures were in the 120s to 130s.  Nebivolol  was discontinued due to bradycardia and dizziness.  There was a discussion of starting carvedilol but he wanted to work on diet and exercise.  He then stopped taking spironolactone  due to numbness and tingling in his arms, hands, and fingers.  He reported that his symptoms resolved.  He was started on doxazosin and again reported numbness and tingling in his hands as well as swelling.  He decided to take his spironolactone  every other day or every third day.  At the visit 03/2023 blood pressure was 184/96.  He was started on chlorthalidone  and referred to the advanced hypertension clinic.  He had a coronary calcium score 11/2022 that was 1.22.  This was 9th percentile for age and gender.  Echo 04/2021 revealed LVEF 60-65% with normal diastolic function.  He struggled from inability to tolerate multiple medications.  At his visit 06/2023 blood pressure was 135/88.  He had slight side effects that he attributed to chlorthalidone  but overall felt they were manageable.  Home blood pressures average in the 120s over 80s so his medication was not changed.  Discussed the use of AI scribe software for clinical note transcription with the patient, who gave verbal consent to proceed.  History of Present Illness [name] is currently on chlorthalidone  for blood pressure management and has  discontinued nebivolol , spironolactone , and doxazosin. Blood pressure readings have been stable, averaging in the 120s, with no lightheadedness or dizziness. He engages in regular physical activity, walking between 8,000 to 16,000 steps daily, and feels better with exercise. No chest pain or pressure during physical activity.  His diet is generally balanced, although he occasionally eats more than intended. He maintains healthy cholesterol levels, with an LDL of 96 as of March, and a calcium score from two years ago that was minimal for his age group.  He reports swelling in his left ankle, which he manages with an ankle brace. There is no history of injury to this ankle, but he did sustain a ligament injury to his right ankle in the past while in the [employer]. Occasional discomfort in his wrists and thumbs is noted, but these symptoms have decreased.  He does not smoke or drink alcohol , attributing his good health to these lifestyle choices.   Previous antihypertensives: Beta blockers ACE inhibitor ARB Calcium channel blocker  Past Medical History:  Diagnosis Date   Allergic reaction to grass pollen    Anxiety    JUST OVER IMPENDING PROSTATE SURGERY   Bradycardia    Dermatitis    Deviated septum    Disturbance of skin sensation    Dyspepsia    ED (erectile dysfunction)    Hearing loss    Hyperhidrosis    Hypertension    NEW DIAGNOSIS OF HYPERTENSION - STARTED MEDICATION FOR B/P 02/20/14   Hypertensive chronic kidney disease  Internal hemorrhoids    Malignant neoplasm of prostate (HCC)    Obesity    Osteoarthritis    Polyp of colon    Prostate cancer (HCC) 06/05/2008   gleason 6   Proteinuria    Sleep apnea    USES CPAP SETTING 7   Urinary incontinence    Urticaria     Past Surgical History:  Procedure Laterality Date   COLONOSCOPY     LYMPHADENECTOMY Bilateral 04/12/2014   Procedure: BILATERAL LYMPHADENECTOMY;  Surgeon: Florencio Hunting, MD;  Location: [facility];  Service:  Urology;  Laterality: Bilateral;   PROSTATE BIOPSY  x 5   Gleason 4+3=7, PSA 5.25 on 12/20/13, volume 65.1 cc   ROBOT ASSISTED LAPAROSCOPIC RADICAL PROSTATECTOMY N/A 04/12/2014   Procedure: ROBOTIC ASSISTED LAPAROSCOPIC RADICAL PROSTATECTOMY (LEVEL 2);  Surgeon: Florencio Hunting, MD;  Location: [facility];  Service: Urology;  Laterality: N/A;   salivary gland surgey     stone removed as teen   TONSILLECTOMY     child    Current Medications: Current Meds  Medication Sig   clindamycin (CLEOCIN T) 1 % lotion APPLY MODERATE AMOUNT TO AFFECTED AREA DAILY AS NEEDED FOR RASH   fluticasone -salmeterol (ADVAIR HFA) 115-21 MCG/ACT inhaler Inhale 2 puffs into the lungs 2 (two) times daily.   hydrocortisone 2.5 % cream daily.   lidocaine  (LIDODERM ) 5 % 1 patch.   loratadine (CLARITIN) 10 MG tablet Take 10 mg by mouth daily.   nystatin  (MYCOSTATIN ) 100000 UNIT/ML suspension Use as directed 5 mLs (500,000 Units total) in the mouth or throat 4 (four) times daily. Swish, gargle, spit   [DISCONTINUED] chlorthalidone  (HYGROTON ) 25 MG tablet Take 1 tablet (25 mg total) by mouth daily.     Allergies:   Doxazosin, Doxycycline, Hydralazine, Labetalol, Nebivolol  hcl, Amlodipine, Clonidine, Clonidine derivatives, Losartan, Spironolactone , Sulfamethoxazole-trimethoprim, Wixela inhub [fluticasone -salmeterol], and Lisinopril   Social History   Socioeconomic History   Marital status: Married    Spouse name: Not on file   Number of children: Not on file   Years of education: Not on file   Highest education level: Not on file  Occupational History   Not on file  Tobacco Use   Smoking status: Never    Passive exposure: Never   Smokeless tobacco: Never  Substance and Sexual Activity   Alcohol  use: No   Drug use: No   Sexual activity: Not on file  Other Topics Concern   Not on file  Social History Narrative   Not on file   Social Drivers of Health   Financial Resource Strain: Low Risk  (07/07/2023)   Overall  Financial Resource Strain (CARDIA)    Difficulty of Paying Living Expenses: Not hard at all  Food Insecurity: No Food Insecurity (07/07/2023)   Hunger Vital Sign    Worried About Running Out of Food in the Last Year: Never true    Ran Out of Food in the Last Year: Never true  Transportation Needs: No Transportation Needs (07/07/2023)   PRAPARE - Administrator, Civil Service (Medical): No    Lack of Transportation (Non-Medical): No  Physical Activity: Sufficiently Active (07/07/2023)   Exercise Vital Sign    Days of Exercise per Week: 7 days    Minutes of Exercise per Session: 120 min  Stress: No Stress Concern Present (07/07/2023)   [employer] of Occupational Health - Occupational Stress Questionnaire    Feeling of Stress : Only a little  Social Connections: Moderately Integrated (07/07/2023)   Social  Connection and Isolation Panel    Frequency of Communication with Friends and Family: More than three times a week    Frequency of Social Gatherings with Friends and Family: Patient unable to answer    Attends Religious Services: More than 4 times per year    Active Member of [employer] or Organizations: No    Attends Banker Meetings: Never    Marital Status: Married     Family History: The patient's family history includes Cancer in an other family member; Hypertension in his mother; Seizures in his brother.  ROS:   Please see the history of present illness.    All other systems reviewed and are negative.  EKGs/Labs/Other Studies Reviewed:    EKG:  EKG is not ordered today.   Recent Labs: 04/23/2023: BUN 12; Creatinine, Ser 1.04; Potassium 3.8; Sodium 137   Recent Lipid Panel No results found for: CHOL, TRIG, HDL, CHOLHDL, VLDL, LDLCALC, LDLDIRECT  Physical Exam:   VS:  BP (!) 142/68   Pulse (!) 54   Wt 232 lb 12.8 oz (105.6 kg)   SpO2 98%   BMI 32.93 kg/m  , BMI Body mass index is 32.93 kg/m. GENERAL:  Well appearing HEENT:  Pupils equal round and reactive, fundi not visualized, oral mucosa unremarkable NECK:  No jugular venous distention, waveform within normal limits, carotid upstroke brisk and symmetric, no bruits, no thyromegaly LUNGS:  Clear to auscultation bilaterally HEART:  RRR.  PMI not displaced or sustained,S1 and S2 within normal limits, no S3, no S4, no clicks, no rubs, no murmurs ABD:  Flat, positive bowel sounds normal in frequency in pitch, no bruits, no rebound, no guarding, no midline pulsatile mass, no hepatomegaly, no splenomegaly EXT:  2 plus pulses throughout, no edema, no cyanosis no clubbing SKIN:  No rashes no nodules NEURO:  Cranial nerves II through XII grossly intact, motor grossly intact throughout PSYCH:  Cognitively intact, oriented to person place and time   ASSESSMENT/PLAN:    # Hypertension:  BP now well-controlled on chlorthalidone .  Home blood pressures consistently averaging in the 120s.  His blood pressure is always higher in the office indicating whitecoat hypertension superimposed on essential hypertension.  Continue chlorthalidone .  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  He was in the 9th percentile 2 years ago.  LDL was 96 on 09/2023.  Continue with diet and exercise.  Screening for Secondary Hypertension:     Relevant Labs/Studies:    Latest Ref Rng & Units 04/23/2023   11:39 AM 05/09/2021   10:00 AM 04/09/2014   11:05 AM  Basic Labs  Sodium 134 - 144 mmol/L 137  141  140   Potassium 3.5 - 5.2 mmol/L 3.8  4.1  4.3   Creatinine 0.76 - 1.27 mg/dL 8.84  1.66  0.63     Disposition:    FU with MD/PharmD in 1 year   Medication Adjustments/Labs and Tests Ordered: Current medicines are reviewed at length with the patient today.  Concerns regarding medicines are outlined above.  Orders Placed This Encounter  Procedures   EKG 12-Lead   Meds ordered this encounter  Medications   chlorthalidone  (HYGROTON ) 25 MG tablet    Sig: Take 1 tablet (25 mg  total) by mouth daily.    Dispense:  90 tablet    Refill:  3     Signed, Maudine Sos, MD  01/12/2024 3:13 PM    [facility]

## 2024-03-09 ENCOUNTER — Encounter (HOSPITAL_BASED_OUTPATIENT_CLINIC_OR_DEPARTMENT_OTHER): Admitting: Family

## 2024-04-06 ENCOUNTER — Encounter (HOSPITAL_BASED_OUTPATIENT_CLINIC_OR_DEPARTMENT_OTHER): Admitting: Cardiovascular Disease

## 2024-05-23 DIAGNOSIS — Z8546 Personal history of malignant neoplasm of prostate: Secondary | ICD-10-CM | POA: Diagnosis not present

## 2024-05-23 DIAGNOSIS — C61 Malignant neoplasm of prostate: Secondary | ICD-10-CM | POA: Diagnosis not present

## 2024-05-30 DIAGNOSIS — R399 Unspecified symptoms and signs involving the genitourinary system: Secondary | ICD-10-CM | POA: Diagnosis not present

## 2024-05-30 DIAGNOSIS — C61 Malignant neoplasm of prostate: Secondary | ICD-10-CM | POA: Diagnosis not present
